# Patient Record
Sex: Female | Born: 1965 | Race: White | Hispanic: No | Marital: Married | Smoking: Never smoker
Health system: Southern US, Community
[De-identification: ages and names within clinical notes are randomized; demographics above are authoritative.]

## PROBLEM LIST (undated history)

## (undated) DIAGNOSIS — I10 Essential (primary) hypertension: Secondary | ICD-10-CM

## (undated) DIAGNOSIS — F32A Depression, unspecified: Secondary | ICD-10-CM

## (undated) DIAGNOSIS — F419 Anxiety disorder, unspecified: Secondary | ICD-10-CM

## (undated) DIAGNOSIS — N189 Chronic kidney disease, unspecified: Secondary | ICD-10-CM

## (undated) DIAGNOSIS — M1990 Unspecified osteoarthritis, unspecified site: Secondary | ICD-10-CM

## (undated) DIAGNOSIS — R9230 Dense breasts, unspecified: Secondary | ICD-10-CM

## (undated) DIAGNOSIS — R922 Inconclusive mammogram: Secondary | ICD-10-CM

## (undated) DIAGNOSIS — I509 Heart failure, unspecified: Secondary | ICD-10-CM

## (undated) HISTORY — DX: Dense breasts, unspecified: R92.30

## (undated) HISTORY — DX: Inconclusive mammogram: R92.2

## (undated) HISTORY — PX: NO PAST SURGERIES: SHX2092

## (undated) HISTORY — DX: Chronic kidney disease, unspecified: N18.9

---

## 2000-10-05 ENCOUNTER — Ambulatory Visit (HOSPITAL_COMMUNITY): Admission: RE | Admit: 2000-10-05 | Discharge: 2000-10-05 | Payer: Self-pay | Admitting: Obstetrics and Gynecology

## 2000-10-05 ENCOUNTER — Encounter: Payer: Self-pay | Admitting: Obstetrics and Gynecology

## 2001-03-25 ENCOUNTER — Other Ambulatory Visit: Admission: RE | Admit: 2001-03-25 | Discharge: 2001-03-25 | Payer: Self-pay | Admitting: Obstetrics and Gynecology

## 2001-10-19 ENCOUNTER — Ambulatory Visit (HOSPITAL_COMMUNITY): Admission: RE | Admit: 2001-10-19 | Discharge: 2001-10-19 | Payer: Self-pay | Admitting: Obstetrics and Gynecology

## 2001-10-19 ENCOUNTER — Encounter: Payer: Self-pay | Admitting: Obstetrics and Gynecology

## 2002-08-10 ENCOUNTER — Other Ambulatory Visit: Admission: RE | Admit: 2002-08-10 | Discharge: 2002-08-10 | Payer: Self-pay | Admitting: Obstetrics and Gynecology

## 2003-12-07 ENCOUNTER — Encounter: Admission: RE | Admit: 2003-12-07 | Discharge: 2003-12-07 | Payer: Self-pay | Admitting: Obstetrics and Gynecology

## 2004-01-24 ENCOUNTER — Other Ambulatory Visit: Admission: RE | Admit: 2004-01-24 | Discharge: 2004-01-24 | Payer: Self-pay | Admitting: Obstetrics and Gynecology

## 2005-02-10 ENCOUNTER — Other Ambulatory Visit: Admission: RE | Admit: 2005-02-10 | Discharge: 2005-02-10 | Payer: Self-pay | Admitting: Obstetrics and Gynecology

## 2005-03-17 ENCOUNTER — Encounter: Admission: RE | Admit: 2005-03-17 | Discharge: 2005-03-17 | Payer: Self-pay | Admitting: Obstetrics and Gynecology

## 2006-02-18 ENCOUNTER — Other Ambulatory Visit: Admission: RE | Admit: 2006-02-18 | Discharge: 2006-02-18 | Payer: Self-pay | Admitting: Obstetrics and Gynecology

## 2006-03-23 ENCOUNTER — Encounter: Admission: RE | Admit: 2006-03-23 | Discharge: 2006-03-23 | Payer: Self-pay | Admitting: Obstetrics and Gynecology

## 2007-04-20 ENCOUNTER — Encounter: Admission: RE | Admit: 2007-04-20 | Discharge: 2007-04-20 | Payer: Self-pay | Admitting: Obstetrics and Gynecology

## 2008-10-31 ENCOUNTER — Encounter: Admission: RE | Admit: 2008-10-31 | Discharge: 2008-10-31 | Payer: Self-pay | Admitting: Obstetrics and Gynecology

## 2010-02-14 ENCOUNTER — Encounter: Admission: RE | Admit: 2010-02-14 | Discharge: 2010-02-14 | Payer: Self-pay | Admitting: Obstetrics and Gynecology

## 2010-07-20 ENCOUNTER — Encounter: Payer: Self-pay | Admitting: Obstetrics and Gynecology

## 2011-09-01 ENCOUNTER — Other Ambulatory Visit: Payer: Self-pay | Admitting: Obstetrics and Gynecology

## 2011-09-01 DIAGNOSIS — Z1231 Encounter for screening mammogram for malignant neoplasm of breast: Secondary | ICD-10-CM

## 2011-09-10 ENCOUNTER — Ambulatory Visit
Admission: RE | Admit: 2011-09-10 | Discharge: 2011-09-10 | Disposition: A | Discharge status: Home / Self Care | Payer: 59 | Source: Ambulatory Visit | Attending: Obstetrics and Gynecology | Admitting: Obstetrics and Gynecology

## 2011-09-10 DIAGNOSIS — Z1231 Encounter for screening mammogram for malignant neoplasm of breast: Secondary | ICD-10-CM

## 2011-09-12 ENCOUNTER — Ambulatory Visit: Payer: Self-pay

## 2011-09-12 ENCOUNTER — Encounter (HOSPITAL_BASED_OUTPATIENT_CLINIC_OR_DEPARTMENT_OTHER): Payer: Self-pay | Admitting: *Deleted

## 2011-09-12 NOTE — Progress Notes (Signed)
To come in for bmet and ekg-has never had ekg-on bp meds To bring names of meds and doses Bring meds dos-and take bp med with water

## 2011-09-15 ENCOUNTER — Encounter (HOSPITAL_BASED_OUTPATIENT_CLINIC_OR_DEPARTMENT_OTHER)
Admission: RE | Admit: 2011-09-15 | Discharge: 2011-09-15 | Disposition: A | Discharge status: Home / Self Care | Payer: 59 | Source: Ambulatory Visit | Attending: Plastic Surgery | Admitting: Plastic Surgery

## 2011-09-15 ENCOUNTER — Other Ambulatory Visit: Payer: Self-pay

## 2011-09-15 LAB — BASIC METABOLIC PANEL
BUN: 17 mg/dL (ref 6–23)
CO2: 32 mEq/L (ref 19–32)
Calcium: 9.2 mg/dL (ref 8.4–10.5)
Chloride: 100 mEq/L (ref 96–112)
Creatinine, Ser: 1.2 mg/dL — ABNORMAL HIGH (ref 0.50–1.10)
GFR calc Af Amer: 62 mL/min — ABNORMAL LOW (ref >90)
GFR calc non Af Amer: 53 mL/min — ABNORMAL LOW (ref >90)
Glucose, Bld: 87 mg/dL (ref 70–99)
Potassium: 3.3 mEq/L — ABNORMAL LOW (ref 3.5–5.1)
Sodium: 140 mEq/L (ref 135–145)

## 2011-09-16 ENCOUNTER — Ambulatory Visit (HOSPITAL_BASED_OUTPATIENT_CLINIC_OR_DEPARTMENT_OTHER)
Admission: RE | Admit: 2011-09-16 | Discharge: 2011-09-16 | Disposition: A | Discharge status: Home / Self Care | Payer: 59 | Source: Ambulatory Visit | Attending: Plastic Surgery | Admitting: Plastic Surgery

## 2011-09-16 ENCOUNTER — Encounter (HOSPITAL_BASED_OUTPATIENT_CLINIC_OR_DEPARTMENT_OTHER): Payer: Self-pay | Admitting: Certified Registered Nurse Anesthetist

## 2011-09-16 ENCOUNTER — Ambulatory Visit (HOSPITAL_BASED_OUTPATIENT_CLINIC_OR_DEPARTMENT_OTHER): Payer: 59 | Admitting: Certified Registered Nurse Anesthetist

## 2011-09-16 ENCOUNTER — Encounter (HOSPITAL_BASED_OUTPATIENT_CLINIC_OR_DEPARTMENT_OTHER): Payer: Self-pay | Admitting: *Deleted

## 2011-09-16 ENCOUNTER — Encounter (HOSPITAL_BASED_OUTPATIENT_CLINIC_OR_DEPARTMENT_OTHER)
Admission: RE | Disposition: A | Discharge status: Home / Self Care | Payer: Self-pay | Source: Ambulatory Visit | Attending: Plastic Surgery

## 2011-09-16 DIAGNOSIS — N62 Hypertrophy of breast: Secondary | ICD-10-CM | POA: Insufficient documentation

## 2011-09-16 DIAGNOSIS — Z01810 Encounter for preprocedural cardiovascular examination: Secondary | ICD-10-CM | POA: Insufficient documentation

## 2011-09-16 HISTORY — PX: BREAST REDUCTION SURGERY: SHX8

## 2011-09-16 HISTORY — DX: Essential (primary) hypertension: I10

## 2011-09-16 HISTORY — DX: Unspecified osteoarthritis, unspecified site: M19.90

## 2011-09-16 LAB — POCT HEMOGLOBIN-HEMACUE: Hemoglobin: 12.4 g/dL (ref 12.0–15.0)

## 2011-09-16 SURGERY — MAMMOPLASTY, REDUCTION
Anesthesia: General | Site: Breast | Laterality: Bilateral | Wound class: Clean

## 2011-09-16 MED ORDER — LIDOCAINE HCL (CARDIAC) 20 MG/ML IV SOLN
INTRAVENOUS | Status: DC | PRN
  Administered 2011-09-16: 60 mg via INTRAVENOUS

## 2011-09-16 MED ORDER — BACITRACIN ZINC 500 UNIT/GM EX OINT
TOPICAL_OINTMENT | CUTANEOUS | Status: DC | PRN
  Administered 2011-09-16: 2 via TOPICAL

## 2011-09-16 MED ORDER — DEXAMETHASONE SODIUM PHOSPHATE 4 MG/ML IJ SOLN
INTRAMUSCULAR | Status: DC | PRN
  Administered 2011-09-16: 10 mg via INTRAVENOUS

## 2011-09-16 MED ORDER — PROPOFOL 10 MG/ML IV EMUL
INTRAVENOUS | Status: DC | PRN
  Administered 2011-09-16: 240 mg via INTRAVENOUS

## 2011-09-16 MED ORDER — OXYCODONE-ACETAMINOPHEN 5-325 MG PO TABS
1.0000 | ORAL_TABLET | Freq: Once | ORAL | Status: AC
  Administered 2011-09-16: 1 via ORAL

## 2011-09-16 MED ORDER — EPHEDRINE SULFATE 50 MG/ML IJ SOLN
INTRAMUSCULAR | Status: DC | PRN
  Administered 2011-09-16: 10 mg via INTRAVENOUS

## 2011-09-16 MED ORDER — HYDROMORPHONE HCL PF 1 MG/ML IJ SOLN
0.2500 mg | INTRAMUSCULAR | Status: DC | PRN
  Administered 2011-09-16 (×3): 0.5 mg via INTRAVENOUS

## 2011-09-16 MED ORDER — MIDAZOLAM HCL 5 MG/5ML IJ SOLN
INTRAMUSCULAR | Status: DC | PRN
  Administered 2011-09-16: 2 mg via INTRAVENOUS

## 2011-09-16 MED ORDER — BUPIVACAINE-EPINEPHRINE 0.5% -1:200000 IJ SOLN
INTRAMUSCULAR | Status: DC | PRN
  Administered 2011-09-16: 40 mL

## 2011-09-16 MED ORDER — LACTATED RINGERS IV SOLN
INTRAVENOUS | Status: DC
  Administered 2011-09-16 (×3): via INTRAVENOUS

## 2011-09-16 MED ORDER — CEFAZOLIN SODIUM 1-5 GM-% IV SOLN: 1.0000 g | Freq: Once | INTRAVENOUS | Status: DC

## 2011-09-16 MED ORDER — ONDANSETRON HCL 4 MG/2ML IJ SOLN: 4.0000 mg | Freq: Once | INTRAMUSCULAR | Status: DC | PRN

## 2011-09-16 MED ORDER — SUCCINYLCHOLINE CHLORIDE 20 MG/ML IJ SOLN
INTRAMUSCULAR | Status: DC | PRN
  Administered 2011-09-16: 120 mg via INTRAVENOUS

## 2011-09-16 MED ORDER — FENTANYL CITRATE 0.05 MG/ML IJ SOLN
INTRAMUSCULAR | Status: DC | PRN
  Administered 2011-09-16 (×2): 50 ug via INTRAVENOUS
  Administered 2011-09-16 (×2): 100 ug via INTRAVENOUS
  Administered 2011-09-16: 50 ug via INTRAVENOUS

## 2011-09-16 MED ORDER — CEFAZOLIN SODIUM 1-5 GM-% IV SOLN
INTRAVENOUS | Status: DC | PRN
  Administered 2011-09-16: 2 g via INTRAVENOUS

## 2011-09-16 MED ORDER — LIDOCAINE-EPINEPHRINE 1 %-1:100000 IJ SOLN
INTRAMUSCULAR | Status: DC | PRN
  Administered 2011-09-16: 40 mL

## 2011-09-16 MED ORDER — 0.9 % SODIUM CHLORIDE (POUR BTL) OPTIME
TOPICAL | Status: DC | PRN
  Administered 2011-09-16 (×2): 450 mL

## 2011-09-16 MED ORDER — ONDANSETRON HCL 4 MG/2ML IJ SOLN
INTRAMUSCULAR | Status: DC | PRN
  Administered 2011-09-16: 4 mg via INTRAVENOUS

## 2011-09-16 SURGICAL SUPPLY — 68 items
APL SKNCLS STERI-STRIP NONHPOA (GAUZE/BANDAGES/DRESSINGS) ×2
BANDAGE GAUZE ELAST BULKY 4 IN (GAUZE/BANDAGES/DRESSINGS) ×4 IMPLANT
BENZOIN TINCTURE PRP APPL 2/3 (GAUZE/BANDAGES/DRESSINGS) ×4 IMPLANT
BLADE KNIFE PERSONA 10 (BLADE) ×8 IMPLANT
BLADE KNIFE PERSONA 15 (BLADE) ×6 IMPLANT
CANISTER SUCTION 1200CC (MISCELLANEOUS) ×2 IMPLANT
CAP BOUFFANT 24 BLUE NURSES (PROTECTIVE WEAR) ×2 IMPLANT
CLOTH BEACON ORANGE TIMEOUT ST (SAFETY) ×2 IMPLANT
COVER MAYO STAND STRL (DRAPES) ×2 IMPLANT
COVER TABLE BACK 60X90 (DRAPES) ×2 IMPLANT
DECANTER SPIKE VIAL GLASS SM (MISCELLANEOUS) ×4 IMPLANT
DRAIN CHANNEL 10F 3/8 F FF (DRAIN) ×4 IMPLANT
DRAPE LAPAROSCOPIC ABDOMINAL (DRAPES) ×2 IMPLANT
DRSG EMULSION OIL 3X3 NADH (GAUZE/BANDAGES/DRESSINGS) ×4 IMPLANT
DRSG PAD ABDOMINAL 8X10 ST (GAUZE/BANDAGES/DRESSINGS) ×4 IMPLANT
ELECT NDL TIP 2.8 STRL (NEEDLE) IMPLANT
ELECT NEEDLE TIP 2.8 STRL (NEEDLE) IMPLANT
ELECT REM PT RETURN 9FT ADLT (ELECTROSURGICAL) ×2
ELECTRODE REM PT RTRN 9FT ADLT (ELECTROSURGICAL) ×1 IMPLANT
EVACUATOR SILICONE 100CC (DRAIN) ×4 IMPLANT
FILTER 7/8 IN (FILTER) ×2 IMPLANT
GLOVE BIO SURGEON STRL SZ 6.5 (GLOVE) ×4 IMPLANT
GLOVE BIO SURGEON STRL SZ7 (GLOVE) ×3 IMPLANT
GLOVE BIOGEL M STRL SZ7.5 (GLOVE) ×1 IMPLANT
GLOVE BIOGEL PI IND STRL 6.5 (GLOVE) IMPLANT
GLOVE BIOGEL PI IND STRL 7.0 (GLOVE) IMPLANT
GLOVE BIOGEL PI IND STRL 7.5 (GLOVE) IMPLANT
GLOVE BIOGEL PI INDICATOR 6.5 (GLOVE)
GLOVE BIOGEL PI INDICATOR 7.0 (GLOVE) ×1
GLOVE BIOGEL PI INDICATOR 7.5 (GLOVE) ×3
GLOVE SKINSENSE NS SZ7.0 (GLOVE) ×1
GLOVE SKINSENSE STRL SZ7.0 (GLOVE) IMPLANT
GOWN PREVENTION PLUS XLARGE (GOWN DISPOSABLE) ×6 IMPLANT
GOWN PREVENTION PLUS XXLARGE (GOWN DISPOSABLE) ×1 IMPLANT
NDL HYPO 25X1 1.5 SAFETY (NEEDLE) ×1 IMPLANT
NDL SPNL 18GX3.5 QUINCKE PK (NEEDLE) ×1 IMPLANT
NEEDLE HYPO 25X1 1.5 SAFETY (NEEDLE) ×2 IMPLANT
NEEDLE SPNL 18GX3.5 QUINCKE PK (NEEDLE) ×2 IMPLANT
NS IRRIG 1000ML POUR BTL (IV SOLUTION) ×5 IMPLANT
PACK BASIN DAY SURGERY FS (CUSTOM PROCEDURE TRAY) ×2 IMPLANT
PIN SAFETY STERILE (MISCELLANEOUS) ×2 IMPLANT
SCRUB PCMX 4 OZ (MISCELLANEOUS) ×1 IMPLANT
SCRUB TECHNI CARE SURGICAL (MISCELLANEOUS) ×2 IMPLANT
SLEEVE SCD COMPRESS KNEE MED (MISCELLANEOUS) ×2 IMPLANT
SPECIMEN JAR MEDIUM (MISCELLANEOUS) ×5 IMPLANT
SPECIMEN JAR X LARGE (MISCELLANEOUS) IMPLANT
SPONGE GAUZE 4X4 12PLY (GAUZE/BANDAGES/DRESSINGS) ×4 IMPLANT
SPONGE LAP 18X18 X RAY DECT (DISPOSABLE) ×7 IMPLANT
STAPLER VISISTAT 35W (STAPLE) ×4 IMPLANT
STRIP CLOSURE SKIN 1/2X4 (GAUZE/BANDAGES/DRESSINGS) ×8 IMPLANT
SUT ETHILON 3 0 PS 1 (SUTURE) ×2 IMPLANT
SUT MNCRL AB 3-0 PS2 18 (SUTURE) ×8 IMPLANT
SUT MNCRL AB 4-0 PS2 18 (SUTURE) ×5 IMPLANT
SUT MON AB 5-0 PS2 18 (SUTURE) ×4 IMPLANT
SUT PROLENE 2 0 CT2 30 (SUTURE) ×2 IMPLANT
SUT PROLENE 3 0 PS 1 (SUTURE) ×4 IMPLANT
SUT QUILL PDO 2-0 (SUTURE) ×4 IMPLANT
SYR BULB IRRIGATION 50ML (SYRINGE) ×4 IMPLANT
SYR CONTROL 10ML LL (SYRINGE) ×6 IMPLANT
TOWEL OR 17X24 6PK STRL BLUE (TOWEL DISPOSABLE) ×5 IMPLANT
TOWEL OR NON WOVEN STRL DISP B (DISPOSABLE) ×2 IMPLANT
TRAY DSU PREP LF (CUSTOM PROCEDURE TRAY) ×2 IMPLANT
TRAY FOLEY CATH 14FR (SET/KITS/TRAYS/PACK) ×2 IMPLANT
TUBE CONNECTING 20X1/4 (TUBING) ×2 IMPLANT
UNDERPAD 30X30 INCONTINENT (UNDERPADS AND DIAPERS) ×4 IMPLANT
VAC PENCILS W/TUBING CLEAR (MISCELLANEOUS) ×2 IMPLANT
WATER STERILE IRR 1000ML POUR (IV SOLUTION) ×1 IMPLANT
YANKAUER SUCT BULB TIP NO VENT (SUCTIONS) ×2 IMPLANT

## 2011-09-16 NOTE — H&P (Signed)
  H & P faxed to surgical center. The patient has been re-examined, the H & P reviewed and there is no change in the plan of care.

## 2011-09-16 NOTE — Discharge Instructions (Signed)
1. No lifting greater than 5 lbs with arms for 4 weeks. 2. Empty, strip, record and reactivate JP drains three times a day. 3. Follow-up appointment Friday in office. 4. Percocet 5/325 mg tabs 1-2 tabs po q4-6 hours prn pain (prescription given in office). 5. Duricef 500 mg tab 1 tab po q 12 hours (prescription given in office). 6. Sterapred dose pack as directed (prescription given in office).  Call your surgeon if you experience:   1.  Fever over 101.0. 2.  Inability to urinate. 3.  Nausea and/or vomiting. 4.  Extreme swelling or bruising at the surgical site. 5.  Continued bleeding from the incision. 6.  Increased pain, redness or drainage from the incision. 7.  Problems related to your pain medication.   [facility]  [address] [place], [place] [address] [phone]   Post Anesthesia Home Care Instructions  Activity: Get plenty of rest for the remainder of the day. A responsible adult should stay with you for 24 hours following the procedure.  For the next 24 hours, DO NOT: -Drive a car -Advertising copywriter -Drink alcoholic beverages -Take any medication unless instructed by your physician -Make any legal decisions or sign important papers.  Meals: Start with liquid foods such as gelatin or soup. Progress to regular foods as tolerated. Avoid greasy, spicy, heavy foods. If nausea and/or vomiting occur, drink only clear liquids until the nausea and/or vomiting subsides. Call your physician if vomiting continues.  Special Instructions/Symptoms: Your throat may feel dry or sore from the anesthesia or the breathing tube placed in your throat during surgery. If this causes discomfort, gargle with warm salt water. The discomfort should disappear within 24 hours.

## 2011-09-16 NOTE — Anesthesia Procedure Notes (Signed)
Procedure Name: Intubation Date/Time: 09/16/2011 8:02 AM Performed by: Tobe Kervin D Pre-anesthesia Checklist: Patient identified, Emergency Drugs available, Suction available and Patient being monitored Patient Re-evaluated:Patient Re-evaluated prior to inductionOxygen Delivery Method: Circle System Utilized Preoxygenation: Pre-oxygenation with 100% oxygen Intubation Type: IV induction Ventilation: Mask ventilation without difficulty Laryngoscope Size: Mac and 3 Grade View: Grade I Tube type: Oral Number of attempts: 1 Airway Equipment and Method: stylet and oral airway Placement Confirmation: ETT inserted through vocal cords under direct vision,  positive ETCO2 and breath sounds checked- equal and bilateral Secured at: 21 cm Tube secured with: Tape Dental Injury: Teeth and Oropharynx as per pre-operative assessment

## 2011-09-16 NOTE — Anesthesia Postprocedure Evaluation (Signed)
  Anesthesia Post-op Note  Patient: Meredith Reed  Procedure(s) Performed: Procedure(s) (LRB): MAMMARY REDUCTION  (BREAST) (Bilateral)  Patient Location: PACU  Anesthesia Type: General  Level of Consciousness: awake, alert  and oriented  Airway and Oxygen Therapy: Patient Spontanous Breathing  Post-op Pain: mild  Post-op Assessment: Post-op Vital signs reviewed, Patient's Cardiovascular Status Stable, Respiratory Function Stable, Patent Airway, No signs of Nausea or vomiting and Pain level controlled  Post-op Vital Signs: Reviewed and stable  Complications: No apparent anesthesia complications

## 2011-09-16 NOTE — Anesthesia Preprocedure Evaluation (Signed)
Anesthesia Evaluation  Patient identified by MRN, date of birth, ID band Patient awake    Reviewed: Allergy & Precautions, H&P , NPO status , Patient's Chart, lab work & pertinent test results  Airway Mallampati: I TM Distance: >3 FB Neck ROM: Full    Dental  (+) Teeth Intact and Dental Advisory Given   Pulmonary  breath sounds clear to auscultation        Cardiovascular Rhythm:Regular Rate:Normal     Neuro/Psych    GI/Hepatic   Endo/Other    Renal/GU      Musculoskeletal   Abdominal   Peds  Hematology   Anesthesia Other Findings   Reproductive/Obstetrics                           Anesthesia Physical Anesthesia Plan  ASA: II  Anesthesia Plan: General   Post-op Pain Management:    Induction: Intravenous  Airway Management Planned: Oral ETT  Additional Equipment:   Intra-op Plan:   Post-operative Plan: Extubation in OR  Informed Consent: I have reviewed the patients History and Physical, chart, labs and discussed the procedure including the risks, benefits and alternatives for the proposed anesthesia with the patient or authorized representative who has indicated his/her understanding and acceptance.   Dental advisory given  Plan Discussed with: CRNA, Anesthesiologist and Surgeon  Anesthesia Plan Comments:         Anesthesia Quick Evaluation

## 2011-09-16 NOTE — Brief Op Note (Signed)
09/16/2011  11:27 AM  PATIENT:  Meredith Reed  46 y.o. female  PRE-OPERATIVE DIAGNOSIS:  Bilateral Macromastia  POST-OPERATIVE DIAGNOSIS:  Bilateral Macromastia  PROCEDURE:  Procedure(s) (LRB): MAMMARY REDUCTION  (BREAST) (Bilateral)  SURGEON:  Surgeon(s) and Role:    * Kayren Eaves Wyndell Cardiff, MD - Primary  ASSISTANTS: None   ANESTHESIA:   general  EBL:  Total I/O In: 2000 [I.V.:2000] Out: 675 [Urine:525; Blood:150]  BLOOD ADMINISTERED:none  DRAINS: (74F) Jackson-Pratt drain(s) with closed bulb suction in the breast   LOCAL MEDICATIONS USED:  MARCAINE     SPECIMEN:  Source of Specimen:  Breast  DISPOSITION OF SPECIMEN:  PATHOLOGY  COUNTS:  YES  DICTATION: .Other Dictation: Dictation Number [id number]  PLAN OF CARE: Discharge to home after PACU  PATIENT DISPOSITION:  PACU - hemodynamically stable.   Delay start of Pharmacological VTE agent (>24hrs) due to surgical blood loss or risk of bleeding: not applicable

## 2011-09-16 NOTE — Transfer of Care (Signed)
Immediate Anesthesia Transfer of Care Note  Patient: Meredith Reed  Procedure(s) Performed: Procedure(s) (LRB): MAMMARY REDUCTION  (BREAST) (Bilateral)  Patient Location: PACU  Anesthesia Type: General  Level of Consciousness: awake, alert , oriented and patient cooperative  Airway & Oxygen Therapy: Patient Spontanous Breathing and Patient connected to face mask oxygen  Post-op Assessment: Report given to PACU RN and Post -op Vital signs reviewed and stable  Post vital signs: Reviewed and stable  Complications: No apparent anesthesia complications

## 2011-09-17 NOTE — Op Note (Signed)
Meredith Reed, BROOK[place]               ACCOUNT NO.:  [id number]  MEDICAL RECORD NO.:  [id number]  LOCATION:                                 FACILITY:  PHYSICIAN:  Hetty Blend, M.D.DATE OF BIRTH:  May 22, 1966  DATE OF PROCEDURE:  09/16/2011 DATE OF DISCHARGE:  09/16/2011                              OPERATIVE REPORT   PREOPERATIVE DIAGNOSIS:  Bilateral macromastia.  POSTOPERATIVE DIAGNOSIS:  Bilateral macromastia.  PROCEDURE:  Bilateral reduction mammoplasties.  ATTENDING SURGEON:  Hetty Blend, MD  ANESTHESIA:  General.  ANESTHESIOLOGIST:  Dr. Al Corpus  FLUID REPLACEMENT:  2400 mL crystalloid.  URINE OUTPUT:  545 mL.  ESTIMATED BLOOD LOSS:  150 mL.  COMPLICATIONS:  None.  INDICATIONS FOR THE PROCEDURE:  The patient is a 46 year old, Caucasian female, who has bilateral macromastia.  She presents to undergo bilateral reduction mammoplasties.  PROCEDURE:  The patient was marked in the preop holding area in the pattern of WISE for the future bilateral reduction mammoplasties.  She was then taken back to the OR, placed on table in supine position. After adequate general anesthesia was obtained, the patient's chest was prepped with Techni-Care and draped in sterile fashion.  The bases of the breasts had been injected with 1% lidocaine with epinephrine.  After adequate hemostasis and anesthesia had taken effect, the procedure was begun.  Both of the breast reductions were performed in the following similar manner.  The nipple-areolar complex was marked with 45-mm nipple marker. The skin was then incised and de-epithelialized around the nipple- areolar complex down to the inframammary crease in the inferior pedicle pattern.  Next the medial,superior, and lateral skin flaps were elevated down to the chest wall.The excess fat and glandular tissue were removed from the inferior pedicle.  The nipple-areolar complex was examined and found to be pink and viable.  The wound was  irrigated with saline irrigation.  Meticulous hemostasis was obtained with the Bovie electrocautery.  The inferior pedicle was centralized using 3-0 Prolene suture.  A #10 JP flat fully fluted drain was placed into the wound.  The skin flaps closed at the inverted T junction with a 2-0 Prolene suture.  The incisions were then stapled for temporary closure.  The breasts were compared and found to have good shape and symmetry.  The incisions were then closed from the medial aspect of the [facility] incision from the medial aspect of the JP drain by 1st placing a few 3-0 Monocryl sutures in the dermal layer to loosely close it and then both the dermal and cuticular layer were closed with a 2-0 Quill PDO barbed suture.  Lateral to the JP drain, the incision was closed using 3-0 Monocryl in the dermal layer, followed by 3-0 Monocryl running intracuticular stitch on the skin.  The vertical limb of the WISE pattern was closed in the dermal layer using 3-0 Monocryl suture.  The patient was placed in the upright position.  The future location of the nipple-areolar complexes was marked on both breasts using the 45-mm nipple marker.  She was then placed back in the recumbent position.  Both of the nipple-areolar complexes were brought out onto the breast mounds in the following similar manner.  The skin was incised as marked and removed full thickness into the subcutaneous tissues.  The nipple- areolar complex was examined, found to be pink and viable, then brought out through this aperture and sewn in place using 4-0 Monocryl in the dermal layer, followed by 5-0 Monocryl running intracuticular stitch on the skin.  The cuticular layer of the vertical limb was also closed using 5-0 Monocryl in continuity with the nipple-areolar closure.  The drains were sewn in place using 3-0 nylon suture.  The bases of the breast as well as the pectoralis muscle were infiltrated with 0.5% Marcaine with epinephrine to  provide a postsurgical anesthetic block. The incisions were dressed with benzoin and Steri-Strips and the nipples additionally with bacitracin ointment and Adaptic.  The 4x4s were placed over the incisions, and the patient was placed into light postoperative support bra.  There were no complications.  The patient tolerated the procedure well.  The final needle and sponge counts were reported to be correct.  The patient was then extubated and taken to the recovery room in stable condition.  She was also recovered without complications.  Both the patient and her husband were given proper postoperative wound care instructions, including care of the JP drains.  She was then discharged home in his care in stable condition.  Followup appointment will be on Friday in the office.          ______________________________ Hetty Blend, M.D.     MC/MEDQ  D:  09/16/2011  T:  09/16/2011  Job:  [id number]

## 2011-09-18 ENCOUNTER — Encounter (HOSPITAL_BASED_OUTPATIENT_CLINIC_OR_DEPARTMENT_OTHER): Payer: Self-pay | Admitting: Plastic Surgery

## 2012-03-18 DIAGNOSIS — M1990 Unspecified osteoarthritis, unspecified site: Secondary | ICD-10-CM | POA: Insufficient documentation

## 2012-03-18 DIAGNOSIS — I10 Essential (primary) hypertension: Secondary | ICD-10-CM | POA: Insufficient documentation

## 2012-03-18 DIAGNOSIS — R922 Inconclusive mammogram: Secondary | ICD-10-CM | POA: Insufficient documentation

## 2012-03-22 ENCOUNTER — Ambulatory Visit: Payer: 59 | Admitting: Obstetrics and Gynecology

## 2012-07-22 ENCOUNTER — Telehealth: Payer: Self-pay | Admitting: Obstetrics and Gynecology

## 2012-07-22 NOTE — Telephone Encounter (Signed)
Triage to address

## 2012-07-22 NOTE — Telephone Encounter (Signed)
[facility] pt

## 2012-07-23 NOTE — Telephone Encounter (Signed)
Tc TO pt.   States has been having cycles q 2-3 weeks , sometimes like normal menses and sometimes spotting. No pain or cramping. Not taking any hormones. Is due for annual. Scheduled for eval with EP 06/01/13.  Pt is agreeable with this appt.  To call with bleeding pad or more/hr or any concerns.  Pt verbalizes comprehension.

## 2012-08-02 ENCOUNTER — Ambulatory Visit: Payer: 59 | Admitting: Obstetrics and Gynecology

## 2012-08-02 ENCOUNTER — Encounter: Payer: Self-pay | Admitting: Obstetrics and Gynecology

## 2012-08-02 VITALS — BP 118/70 | Temp 98.8°F | Ht 66.0 in | Wt 209.0 lb

## 2012-08-02 DIAGNOSIS — R3911 Hesitancy of micturition: Secondary | ICD-10-CM

## 2012-08-02 DIAGNOSIS — N926 Irregular menstruation, unspecified: Secondary | ICD-10-CM

## 2012-08-02 DIAGNOSIS — N390 Urinary tract infection, site not specified: Secondary | ICD-10-CM

## 2012-08-02 DIAGNOSIS — B9689 Other specified bacterial agents as the cause of diseases classified elsewhere: Secondary | ICD-10-CM

## 2012-08-02 DIAGNOSIS — N898 Other specified noninflammatory disorders of vagina: Secondary | ICD-10-CM

## 2012-08-02 MED ORDER — METRONIDAZOLE 500 MG PO TABS: 500.0000 mg | ORAL_TABLET | Freq: Two times a day (BID) | ORAL | Status: DC

## 2012-08-02 MED ORDER — CIPROFLOXACIN HCL 500 MG PO TABS: 500.0000 mg | ORAL_TABLET | Freq: Two times a day (BID) | ORAL | Status: DC

## 2012-08-02 NOTE — Progress Notes (Signed)
Vaginal discharge: clearthin mucoid Itching / Burning: no Fever: no  Symptoms have been present for 2 months. Has used over-the-counter treatment: yes otc yeast med.  ? name Associated symptoms:  Pelvic pain: no       Dyspareunia: no     Odor:  yes  History of STD:  no history of PID, STD's STD screen:declined  PT ALSO STATES HER CYCLES ARE CLOSER TOGETHER AND C/O VAGINAL DRYNESS; PT STOPPED PROGESTERONE CREAM AND DO NOT KNOW IF THAT IS THE PROBLEM.

## 2012-08-02 NOTE — Patient Instructions (Signed)
Avoid: - excess soap on genital area (consider using plain oatmeal soap) - use of powder or sprays in genital area - douching - wearing underwear to bed (except with menses) - using more than is directed detergent when washing clothes - tight fitting garments around genital area - excess sugar intake   

## 2012-08-02 NOTE — Progress Notes (Signed)
47 YO complains of vaginal dryness and states that her periods are closer together. Periods occur every 14 days for the past 2 months (instead of 3 weeks).  Flow 3 days with pad change 4 times a day and no cramping.  Has had some spotting x 2 days randomly with panty liner change 2 xs /day.  Denies bleeding with intercourse, stopped using  Progesterone 14 days out of the month and has experience some urinary hesitancy with voiding small amounts when she goes.  Denies any bowel changes, pelvic pain or fever.  O: Pelvic:EGBUS-normal, vagina-moderate mucoid discharge, cervix-no lesions, uterus/adnexae-no tenderness or masses  Wet Prep:  pH-5.5, whiff-positive,  + clue cells U/A- leuk- 1+,  pH-8.0, SG-1.005  protein & blood 2+ UPT-negative  A: BV     Change in Menses (shortened cycle)     UTI  P:  CBC, TSH- pending      Consult Dr. Leo Grosser about further follow up of patient's menstrual changes       Metronidazole 500 mg  #14 bid x 7 days no refills       Cipro 500 mg #6 bid x 3 days no refills       Increase water intake       RTO-as scheduled or prn  Meredith Nutting,PA-C

## 2012-08-03 LAB — CBC
HCT: 37.6 % (ref 34.0–46.6)
Hemoglobin: 12.7 g/dL (ref 11.1–15.9)
MCH: 32 pg (ref 26.6–33.0)
MCHC: 33.8 g/dL (ref 31.5–35.7)
MCV: 95 fL (ref 79–97)
Platelets: 290 10*3/uL (ref 155–379)
RBC: 3.97 x10E6/uL (ref 3.77–5.28)
RDW: 13.1 % (ref 12.3–15.4)
WBC: 7.5 10*3/uL (ref 3.4–10.8)

## 2012-08-03 LAB — URINE CULTURE

## 2012-08-03 LAB — TSH: TSH: 2.51 u[IU]/mL (ref 0.450–4.500)

## 2012-08-09 ENCOUNTER — Telehealth: Payer: Self-pay

## 2012-08-09 DIAGNOSIS — N926 Irregular menstruation, unspecified: Secondary | ICD-10-CM

## 2012-08-09 MED ORDER — MISOPROSTOL 200 MCG PO TABS: ORAL_TABLET | ORAL | Status: DC

## 2012-08-09 NOTE — Telephone Encounter (Signed)
Message copied by Gus Puma on Mon Aug 09, 2012  3:18 PM ------      Message from: Earnstine Regal      Created: Fri Aug 06, 2012  4:58 PM      Regarding: Gus Rankin,  Please schedule this patient with Dr. Leo Grosser for a [facility] (she's her patient).  Thanks,  EP      ----- Message -----         From: Eldred Manges, MD         Sent: 08/05/2012  10:40 PM           To: Earnstine Regal, PA            shg      ----- Message -----         From: Earnstine Regal, PA         Sent: 08/02/2012   1:05 PM           To: Eldred Manges, MD            Patient with vaginal bleeding every 14 days with occasional spotting.  Awaiting results of TSH,  if normal do you recommend an SHG or ultrasound for further evaluation?  EP             ------

## 2012-08-09 NOTE — Telephone Encounter (Signed)
Tc to pt. SHG/Cytotec info mailed to pt. [place] sched 08/24/12 @ 11:00a and FU with [facility]. Pt voices understanding.

## 2012-08-24 ENCOUNTER — Ambulatory Visit: Payer: 59 | Admitting: Obstetrics and Gynecology

## 2012-08-24 ENCOUNTER — Other Ambulatory Visit: Payer: Self-pay | Admitting: Obstetrics and Gynecology

## 2012-08-24 ENCOUNTER — Ambulatory Visit: Payer: 59

## 2012-08-24 DIAGNOSIS — N926 Irregular menstruation, unspecified: Secondary | ICD-10-CM

## 2012-08-24 DIAGNOSIS — IMO0002 Reserved for concepts with insufficient information to code with codable children: Secondary | ICD-10-CM

## 2012-08-24 LAB — POCT URINE PREGNANCY: Preg Test, Ur: NEGATIVE

## 2012-08-24 NOTE — Progress Notes (Signed)
SONOHYSTEROGRAM  Indications for the procedure, risks and benefits have been reviewed.  Questions were answered.   A permit has been signed.  Marland Kitchen   PROCEDURE The vagina and cervix were prepped with Betadine.  The sonohysterogram catheter was placed inside the uterus. 20 cc of sterile saline were injected.  A 3-D ultrasound was performed.   ENDOMETRIAL BIOPSY   INDICATION: metrorrhagia  UPT: Negative  Ultrasound findings: see below Risks and benefits have been discussed  PROCEDURE Cervix prepped with betadine Tenaculum applied to anterior lip of the cervix: no Uterus sounded at  9  cm Endometrial biopsy easily performed with Pipelle Well tolerated  Specimen appropriately identified and sent to patholgy   The patient tolerated her procedure well.  All instruments were removed.  The patient was returned to the supine position.   ULTRASOUND: Uterus: Length: 6.11 cm   Width:  4.80 cm   Height:  3.17 cm  Endo thickness:  0.493 cm   Left ovary:Normal Right ovary:Abnormal - Simple,dominant follicle-wnl's  Fibroids:yes Intramural fibroids are seen                      1)Anterior:1.4cm x 1.2cm x 1.2cm           2)Anterior left: 1.3cm x 1.3cm x 1.2cm CDS fluid:no  Comment: Smooth endometrial walls are observed. No focal lesion is identified.    Assessment: Metrorrhagia Fibroids  Plan: Will followup based on bx results

## 2012-08-26 LAB — PATHOLOGY

## 2012-09-13 ENCOUNTER — Encounter: Payer: 59 | Admitting: Obstetrics and Gynecology

## 2013-07-11 ENCOUNTER — Other Ambulatory Visit: Payer: Self-pay

## 2013-07-11 DIAGNOSIS — Z9889 Other specified postprocedural states: Secondary | ICD-10-CM

## 2013-07-11 DIAGNOSIS — Z1231 Encounter for screening mammogram for malignant neoplasm of breast: Secondary | ICD-10-CM

## 2013-07-28 ENCOUNTER — Ambulatory Visit: Payer: 59

## 2013-08-12 ENCOUNTER — Ambulatory Visit
Admission: RE | Admit: 2013-08-12 | Discharge: 2013-08-12 | Disposition: A | Discharge status: Home / Self Care | Payer: 59 | Source: Ambulatory Visit

## 2013-08-12 DIAGNOSIS — Z9889 Other specified postprocedural states: Secondary | ICD-10-CM

## 2013-08-12 DIAGNOSIS — Z1231 Encounter for screening mammogram for malignant neoplasm of breast: Secondary | ICD-10-CM

## 2014-01-25 ENCOUNTER — Other Ambulatory Visit (HOSPITAL_COMMUNITY): Payer: Self-pay | Admitting: Nephrology

## 2014-01-25 DIAGNOSIS — N183 Chronic kidney disease, stage 3 unspecified: Secondary | ICD-10-CM

## 2014-01-25 DIAGNOSIS — R809 Proteinuria, unspecified: Secondary | ICD-10-CM

## 2014-02-14 ENCOUNTER — Other Ambulatory Visit: Payer: Self-pay | Admitting: Radiology

## 2014-02-15 ENCOUNTER — Other Ambulatory Visit: Payer: Self-pay | Admitting: Radiology

## 2014-02-17 ENCOUNTER — Encounter (HOSPITAL_COMMUNITY): Payer: Self-pay

## 2014-02-17 ENCOUNTER — Ambulatory Visit (HOSPITAL_COMMUNITY)
Admission: RE | Admit: 2014-02-17 | Discharge: 2014-02-17 | Disposition: A | Discharge status: Home / Self Care | Payer: 59 | Source: Ambulatory Visit | Attending: Nephrology | Admitting: Nephrology

## 2014-02-17 DIAGNOSIS — N183 Chronic kidney disease, stage 3 unspecified: Secondary | ICD-10-CM | POA: Diagnosis present

## 2014-02-17 DIAGNOSIS — Z79899 Other long term (current) drug therapy: Secondary | ICD-10-CM | POA: Insufficient documentation

## 2014-02-17 DIAGNOSIS — I129 Hypertensive chronic kidney disease with stage 1 through stage 4 chronic kidney disease, or unspecified chronic kidney disease: Secondary | ICD-10-CM | POA: Insufficient documentation

## 2014-02-17 DIAGNOSIS — R809 Proteinuria, unspecified: Secondary | ICD-10-CM | POA: Insufficient documentation

## 2014-02-17 LAB — PROTIME-INR
INR: 0.92 (ref 0.00–1.49)
Prothrombin Time: 12.4 seconds (ref 11.6–15.2)

## 2014-02-17 LAB — CBC
HCT: 35.3 % — ABNORMAL LOW (ref 36.0–46.0)
Hemoglobin: 12.1 g/dL (ref 12.0–15.0)
MCH: 32.2 pg (ref 26.0–34.0)
MCHC: 34.3 g/dL (ref 30.0–36.0)
MCV: 93.9 fL (ref 78.0–100.0)
Platelets: 263 10*3/uL (ref 150–400)
RBC: 3.76 MIL/uL — ABNORMAL LOW (ref 3.87–5.11)
RDW: 12.7 % (ref 11.5–15.5)
WBC: 5.7 10*3/uL (ref 4.0–10.5)

## 2014-02-17 LAB — APTT: aPTT: 27 seconds (ref 24–37)

## 2014-02-17 IMAGING — US US BIOPSY
1 series · 11 of 11 positions shown · non-contrast
Comparison: none

CLINICAL DATA: Stage 3 chronic kidney disease, proteinuria

[Series 1: us biopsy · 0.22mm/px · 11 of 11 slices shown]
[im 1/11]
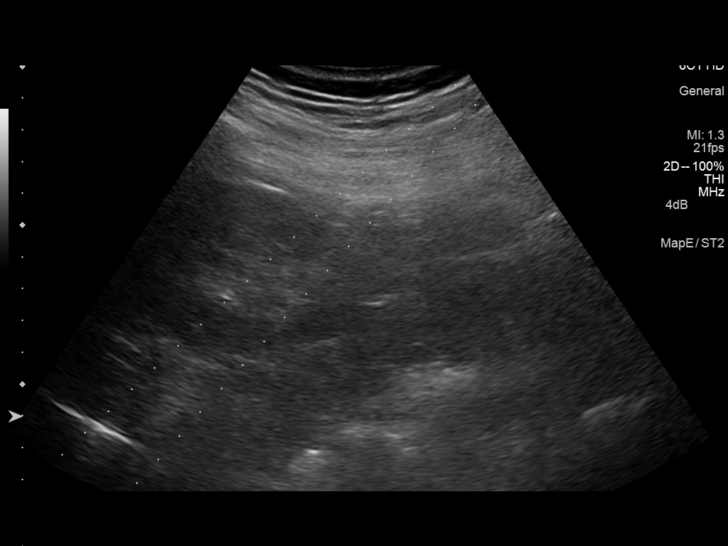
[im 2/11]
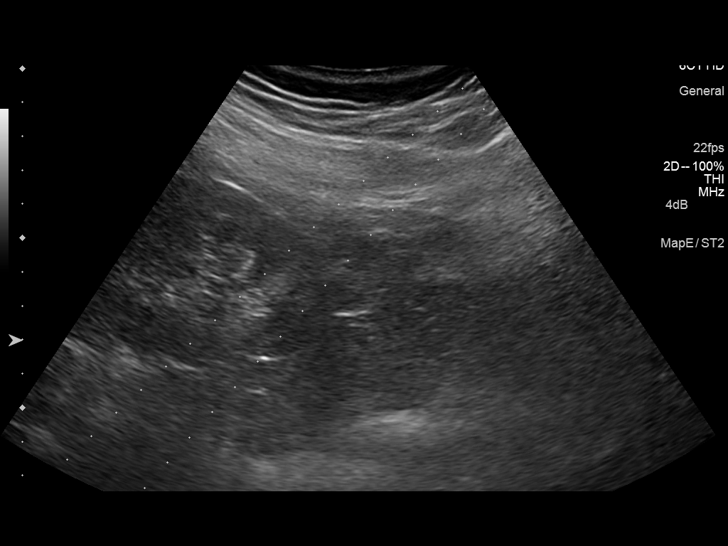
[im 3/11]
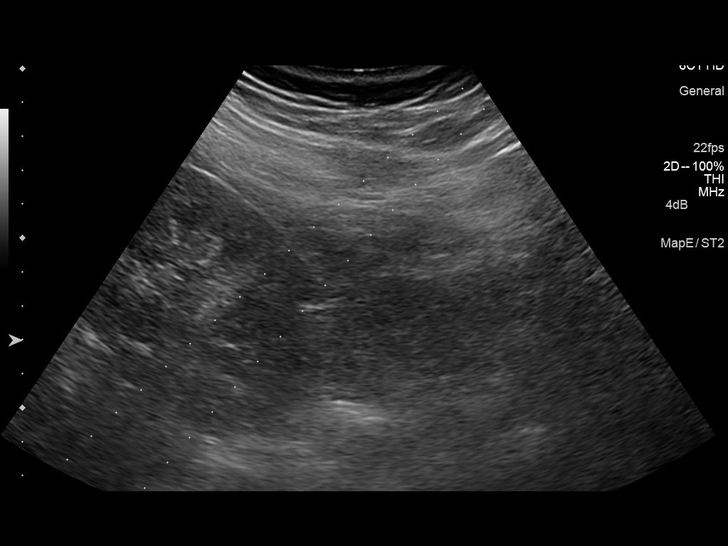
[im 4/11]
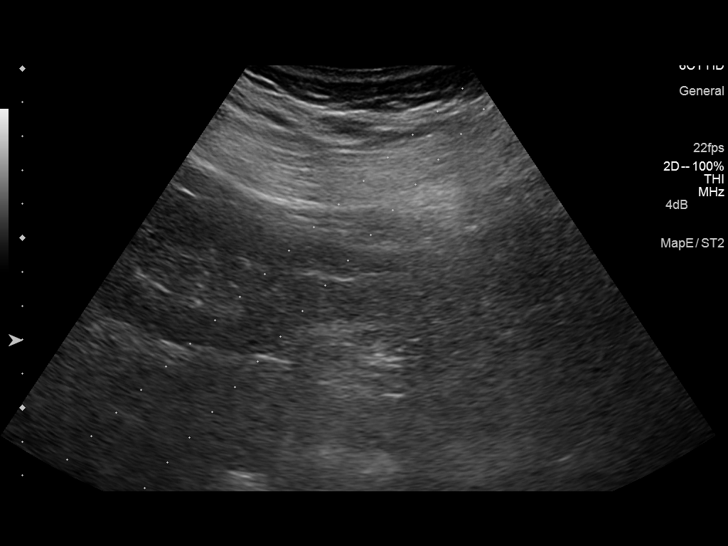
[im 5/11]
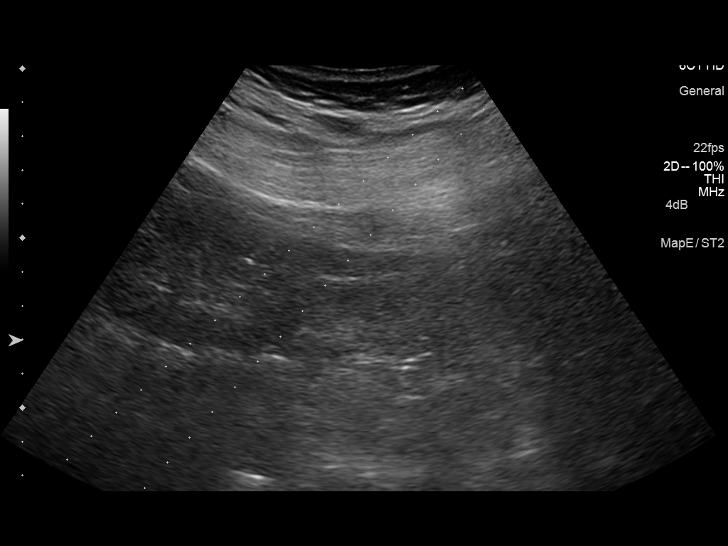
[im 6/11]
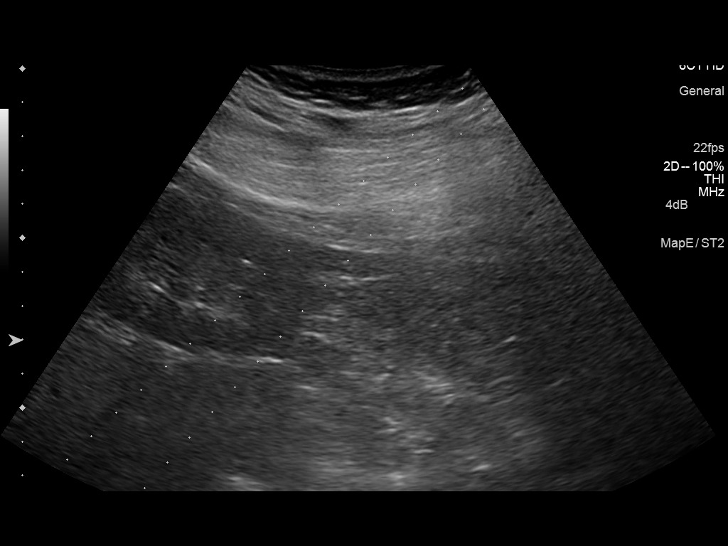
[im 7/11]
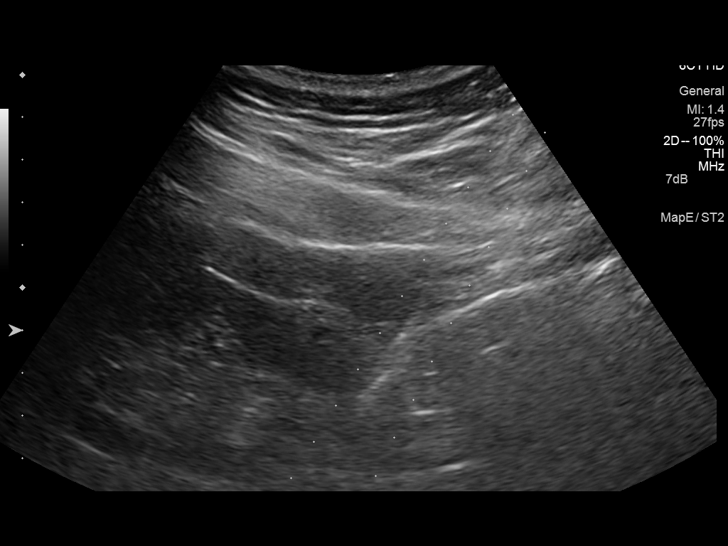
[im 8/11]
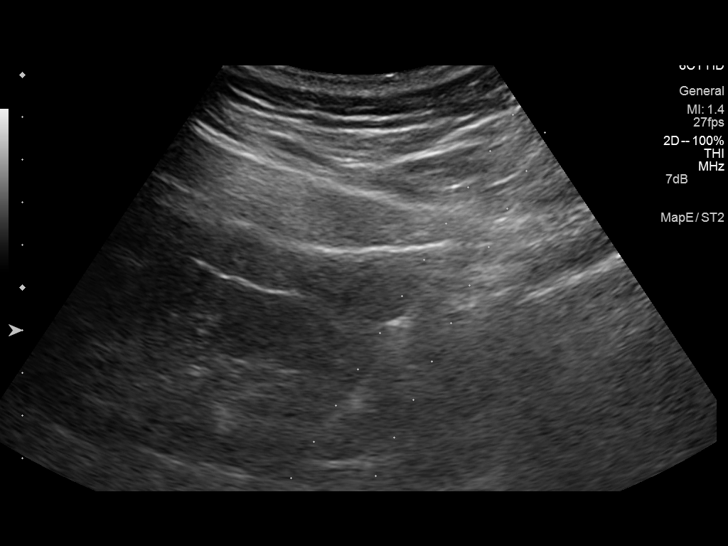
[im 9/11]
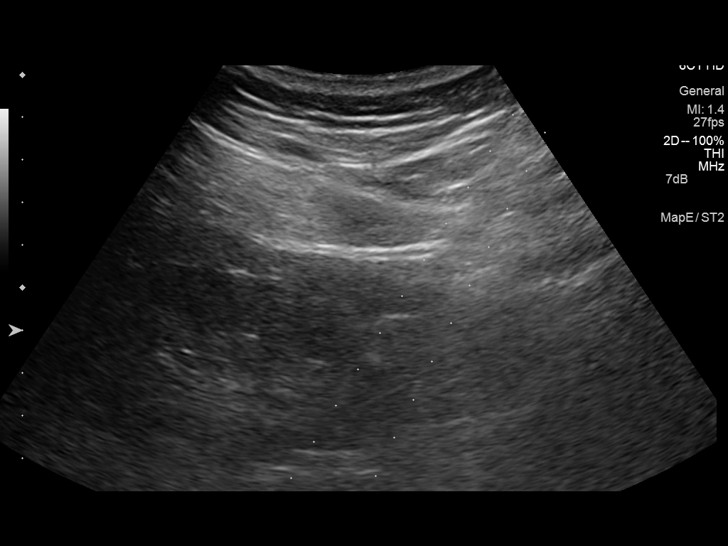
[im 10/11]
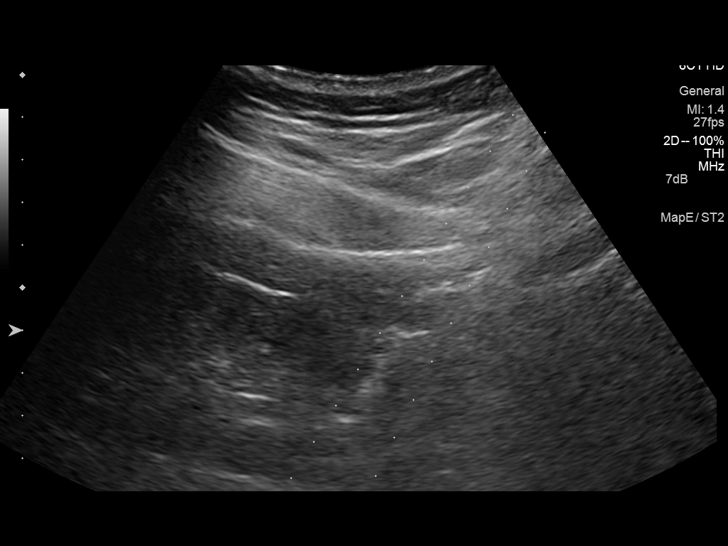
[im 11/11]
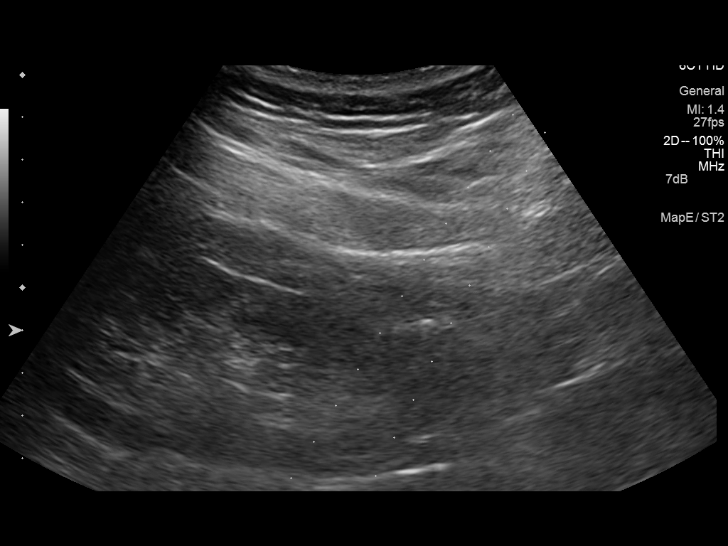

[11 of 11 positions shown; findings below may reference images not displayed]

EXAM:
ULTRASOUND GUIDED CORE BIOPSY OF LEFT RENAL LOWER POLE CORTEX

MEDICATIONS:
12.5 MCG mcg IV Fentanyl

Total Moderate Sedation Time: 15 MIN

PROCEDURE:
The procedure, risks, benefits, and alternatives were explained to
the patient. Questions regarding the procedure were encouraged and
answered. The patient understands and consents to the procedure.

The LEFT POSTERIOR FLANK was prepped with Betadine in a sterile
fashion, and a sterile drape was applied covering the operative
field. A sterile gown and sterile gloves were used for the
procedure. Local anesthesia was provided with 1% Lidocaine.

Patient positioned prone. Preliminary ultrasound performed. Left
kidney lower pole was localized. Under sterile conditions and local
anesthesia, a 16 gauge 15 cm core biopsy needle was advanced to the
left kidney lower pole cortex. Three passes were made for core
biopsy. Samples were placed in saline. No immediate complication.
Patient tolerated the biopsy well.

COMPLICATIONS:
No immediate
FINDINGS: Imaging confirms needle placed in in the left kidney lower pole
cortex
IMPRESSION: Successful ultrasound left kidney 16 gauge biopsy

## 2014-02-17 MED ORDER — MIDAZOLAM HCL 2 MG/2ML IJ SOLN
INTRAMUSCULAR | Status: AC
  Filled 2014-02-17: qty 2

## 2014-02-17 MED ORDER — SODIUM CHLORIDE 0.9 % IV SOLN
INTRAVENOUS | Status: DC
  Administered 2014-02-17: 10:00:00 via INTRAVENOUS

## 2014-02-17 MED ORDER — FENTANYL CITRATE 0.05 MG/ML IJ SOLN
INTRAMUSCULAR | Status: AC
  Filled 2014-02-17: qty 2

## 2014-02-17 MED ORDER — MIDAZOLAM HCL 2 MG/2ML IJ SOLN: INTRAMUSCULAR | Status: AC | PRN

## 2014-02-17 MED ORDER — FENTANYL CITRATE 0.05 MG/ML IJ SOLN
INTRAMUSCULAR | Status: AC | PRN
  Administered 2014-02-17: 12.5 ug via INTRAVENOUS

## 2014-02-17 MED ORDER — LIDOCAINE HCL (PF) 1 % IJ SOLN
INTRAMUSCULAR | Status: AC
  Filled 2014-02-17: qty 10

## 2014-02-17 NOTE — Discharge Instructions (Signed)
Kidney Biopsy, Care After Refer to this sheet in the next few weeks. These instructions provide you with information on caring for yourself after your procedure. Your health care provider may also give you more specific instructions. Your treatment has been planned according to current medical practices, but problems sometimes occur. Call your health care provider if you have any problems or questions after your procedure.  WHAT TO EXPECT AFTER THE PROCEDURE   You may notice blood in the urine for the first 24 hours after the biopsy.  You may feel some pain at the biopsy site for 1-2 weeks after the biopsy. HOME CARE INSTRUCTIONS  Do not lift anything heavier than 10 lb (4.5 kg) for 2 weeks.  Do not take any non-steroidal anti-inflammatory drugs (NSAIDs) or any blood thinners for a week after the biopsy unless instructed to do so by your health care provider.  Only take medicines for pain, fever, or discomfort as directed by your health care provider. SEEK MEDICAL CARE IF:  You have bloody urine more than 24 hours after the biopsy.   You develop a fever.   You cannot urinate.   You have increasing pain at the biopsy site.  SEEK IMMEDIATE MEDICAL CARE IF: You feel faint or dizzy.  Document Released: 02/16/2013 Document Reviewed: 02/16/2013 [facility] Patient Information 2015 [place]. This information is not intended to replace advice given to you by your health care provider. Make sure you discuss any questions you have with your health care provider.

## 2014-02-17 NOTE — Procedures (Signed)
[employer] [facility] RENAL CORE BX 16 G X 3 NO COMP STABLE PATH PENDING FULL REPORT IN PACS

## 2014-02-17 NOTE — H&P (Signed)
Chief Complaint: "I am here for a kidney biopsy." Referring Physician: Dr. Florene Glen HPI: Meredith Reed is an 48 y.o. female with CKD stage 3 and proteinuria. She does admit to some foamy urine and slight LE edema at the end of the day. She denies any hematuria. She denies any chest pain, shortness of breath or palpitations. She denies any active signs of bleeding or excessive bruising. She denies any recent fever or chills. The patient denies any history of sleep apnea or chronic oxygen use. She has previously tolerated anesthesia without complications.   Past Medical History:  Past Medical History  Diagnosis Date  . Hypertension   . Arthritis     knees  . Breast density     Past Surgical History:  Past Surgical History  Procedure Laterality Date  . No past surgeries    . Breast reduction surgery  09/16/2011    Procedure: MAMMARY REDUCTION  (BREAST);  Surgeon: Charlene Brooke, MD;  Location: [place];  Service: Plastics;  Laterality: Bilateral;    Family History:  Family History  Problem Relation Age of Onset  . Emphysema Father   . Hypertension Sister     x2  . Deep vein thrombosis Sister   . Stroke Sister   . Deep vein thrombosis Paternal Grandmother   . Diabetes Paternal Grandmother   . Cancer Paternal Grandfather     ? type  . Diabetes Paternal Grandfather   . Diabetes Mother   . Diabetes Maternal Aunt   . Diabetes Maternal Uncle   . Diabetes Maternal Uncle     Social History:  reports that she has never smoked. She has never used smokeless tobacco. She reports that she does not drink alcohol or use illicit drugs.  Allergies: No Known Allergies  Medications:   Medication List    ASK your doctor about these medications       amLODipine 10 MG tablet  Commonly known as:  NORVASC  Take 10 mg by mouth daily.     cholecalciferol 1000 UNITS tablet  Commonly known as:  VITAMIN D  Take 1,000 Units by mouth daily.     DULoxetine 60 MG capsule   Commonly known as:  CYMBALTA  Take 120 mg by mouth every evening.     losartan-hydrochlorothiazide 100-25 MG per tablet  Commonly known as:  HYZAAR  Take 1 tablet by mouth daily.     magnesium gluconate 500 MG tablet  Commonly known as:  MAGONATE  Take 500 mg by mouth daily as needed (night time leg cramps).     multivitamin with minerals tablet  Take 1 tablet by mouth daily.     vitamin C 500 MG tablet  Commonly known as:  ASCORBIC ACID  Take 500 mg by mouth daily.        Please HPI for pertinent positives, otherwise complete 10 system ROS negative.  Physical Exam: BP 116/74  Pulse 64  Temp(Src) 98.1 F (36.7 C) (Oral)  Resp 17  Ht 5\' 5"  (1.651 m)  Wt 200 lb (90.719 kg)  BMI 33.28 kg/m2  SpO2 100%  LMP 07/28/2012 Body mass index is 33.28 kg/(m^2).   General Appearance:  Alert, cooperative, no distress  Head:  Normocephalic, without obvious abnormality, atraumatic  Neck: Supple, symmetrical, trachea midline  Lungs:   Clear to auscultation bilaterally, no w/r/r, respirations unlabored without use of accessory muscles.  Chest Wall:  No tenderness or deformity  Heart:  Regular rate and rhythm, S1, S2 normal,  no murmur, rub or gallop.  Abdomen:   Soft, non-tender, non distended.  Extremities: Extremities normal, atraumatic, no cyanosis or edema  Neurologic: Normal affect, no gross deficits.   Results for orders placed during the hospital encounter of 02/17/14 (from the past 48 hour(s))  APTT     Status: None   Collection Time    02/17/14  9:23 AM      Result Value Ref Range   aPTT 27  24 - 37 seconds  CBC     Status: Abnormal   Collection Time    02/17/14  9:23 AM      Result Value Ref Range   WBC 5.7  4.0 - 10.5 K/uL   RBC 3.76 (*) 3.87 - 5.11 MIL/uL   Hemoglobin 12.1  12.0 - 15.0 g/dL   HCT 35.3 (*) 36.0 - 46.0 %   MCV 93.9  78.0 - 100.0 fL   MCH 32.2  26.0 - 34.0 pg   MCHC 34.3  30.0 - 36.0 g/dL   RDW 12.7  11.5 - 15.5 %   Platelets 263  150 - 400 K/uL   PROTIME-INR     Status: None   Collection Time    02/17/14  9:23 AM      Result Value Ref Range   Prothrombin Time 12.4  11.6 - 15.2 seconds   INR 0.92  0.00 - 1.49   No results found.  Assessment/Plan Chronic kidney disease Proteinuria Scheduled for image guided renal biopsy with minimal to moderate sedation, patient did take 1 mg Xanax at 0830 Patient has been NPO, no blood thinners taken, labs reviewed, BP 130/80 mmHg Risks and Benefits discussed with the patient. All of the patient's questions were answered, patient is agreeable to proceed. Consent signed and in chart.   Tsosie Billing D PA-C 02/17/2014, 10:44 AM

## 2014-02-24 ENCOUNTER — Encounter (HOSPITAL_COMMUNITY): Payer: Self-pay

## 2014-02-27 ENCOUNTER — Encounter (HOSPITAL_COMMUNITY): Payer: Self-pay

## 2014-05-01 ENCOUNTER — Encounter (HOSPITAL_COMMUNITY): Payer: Self-pay

## 2015-10-05 ENCOUNTER — Other Ambulatory Visit: Payer: Self-pay | Admitting: Gastroenterology

## 2016-07-21 DIAGNOSIS — Z0000 Encounter for general adult medical examination without abnormal findings: Secondary | ICD-10-CM | POA: Diagnosis not present

## 2016-07-21 DIAGNOSIS — I129 Hypertensive chronic kidney disease with stage 1 through stage 4 chronic kidney disease, or unspecified chronic kidney disease: Secondary | ICD-10-CM | POA: Diagnosis not present

## 2016-07-21 DIAGNOSIS — N390 Urinary tract infection, site not specified: Secondary | ICD-10-CM | POA: Diagnosis not present

## 2016-08-28 DIAGNOSIS — Z1231 Encounter for screening mammogram for malignant neoplasm of breast: Secondary | ICD-10-CM | POA: Diagnosis not present

## 2016-08-28 DIAGNOSIS — Z01419 Encounter for gynecological examination (general) (routine) without abnormal findings: Secondary | ICD-10-CM | POA: Diagnosis not present

## 2016-09-09 DIAGNOSIS — N183 Chronic kidney disease, stage 3 (moderate): Secondary | ICD-10-CM | POA: Diagnosis not present

## 2016-09-09 DIAGNOSIS — R809 Proteinuria, unspecified: Secondary | ICD-10-CM | POA: Diagnosis not present

## 2016-09-09 DIAGNOSIS — N058 Unspecified nephritic syndrome with other morphologic changes: Secondary | ICD-10-CM | POA: Diagnosis not present

## 2017-01-22 DIAGNOSIS — N183 Chronic kidney disease, stage 3 (moderate): Secondary | ICD-10-CM | POA: Diagnosis not present

## 2017-01-22 DIAGNOSIS — I129 Hypertensive chronic kidney disease with stage 1 through stage 4 chronic kidney disease, or unspecified chronic kidney disease: Secondary | ICD-10-CM | POA: Diagnosis not present

## 2017-03-11 DIAGNOSIS — N183 Chronic kidney disease, stage 3 (moderate): Secondary | ICD-10-CM | POA: Diagnosis not present

## 2017-03-11 DIAGNOSIS — R809 Proteinuria, unspecified: Secondary | ICD-10-CM | POA: Diagnosis not present

## 2017-03-11 DIAGNOSIS — N058 Unspecified nephritic syndrome with other morphologic changes: Secondary | ICD-10-CM | POA: Diagnosis not present

## 2017-05-08 DIAGNOSIS — I10 Essential (primary) hypertension: Secondary | ICD-10-CM | POA: Diagnosis not present

## 2017-05-08 DIAGNOSIS — N183 Chronic kidney disease, stage 3 (moderate): Secondary | ICD-10-CM | POA: Diagnosis not present

## 2017-05-08 DIAGNOSIS — R5383 Other fatigue: Secondary | ICD-10-CM | POA: Diagnosis not present

## 2017-07-20 DIAGNOSIS — M545 Low back pain: Secondary | ICD-10-CM | POA: Diagnosis not present

## 2017-07-20 DIAGNOSIS — Z0000 Encounter for general adult medical examination without abnormal findings: Secondary | ICD-10-CM | POA: Diagnosis not present

## 2017-07-20 DIAGNOSIS — I129 Hypertensive chronic kidney disease with stage 1 through stage 4 chronic kidney disease, or unspecified chronic kidney disease: Secondary | ICD-10-CM | POA: Diagnosis not present

## 2017-07-20 DIAGNOSIS — N390 Urinary tract infection, site not specified: Secondary | ICD-10-CM | POA: Diagnosis not present

## 2017-07-21 DIAGNOSIS — I129 Hypertensive chronic kidney disease with stage 1 through stage 4 chronic kidney disease, or unspecified chronic kidney disease: Secondary | ICD-10-CM | POA: Diagnosis not present

## 2017-07-23 DIAGNOSIS — H04123 Dry eye syndrome of bilateral lacrimal glands: Secondary | ICD-10-CM | POA: Diagnosis not present

## 2017-07-23 DIAGNOSIS — H5213 Myopia, bilateral: Secondary | ICD-10-CM | POA: Diagnosis not present

## 2017-07-23 DIAGNOSIS — H47323 Drusen of optic disc, bilateral: Secondary | ICD-10-CM | POA: Diagnosis not present

## 2017-09-02 DIAGNOSIS — N058 Unspecified nephritic syndrome with other morphologic changes: Secondary | ICD-10-CM | POA: Diagnosis not present

## 2017-09-02 DIAGNOSIS — N184 Chronic kidney disease, stage 4 (severe): Secondary | ICD-10-CM | POA: Diagnosis not present

## 2017-09-02 DIAGNOSIS — R809 Proteinuria, unspecified: Secondary | ICD-10-CM | POA: Diagnosis not present

## 2017-09-14 DIAGNOSIS — Z01419 Encounter for gynecological examination (general) (routine) without abnormal findings: Secondary | ICD-10-CM | POA: Diagnosis not present

## 2017-09-14 DIAGNOSIS — N952 Postmenopausal atrophic vaginitis: Secondary | ICD-10-CM | POA: Diagnosis not present

## 2017-09-14 DIAGNOSIS — Z1231 Encounter for screening mammogram for malignant neoplasm of breast: Secondary | ICD-10-CM | POA: Diagnosis not present

## 2018-01-21 DIAGNOSIS — I129 Hypertensive chronic kidney disease with stage 1 through stage 4 chronic kidney disease, or unspecified chronic kidney disease: Secondary | ICD-10-CM | POA: Diagnosis not present

## 2018-01-21 DIAGNOSIS — N184 Chronic kidney disease, stage 4 (severe): Secondary | ICD-10-CM | POA: Diagnosis not present

## 2018-01-21 DIAGNOSIS — M545 Low back pain: Secondary | ICD-10-CM | POA: Diagnosis not present

## 2018-02-25 DIAGNOSIS — N184 Chronic kidney disease, stage 4 (severe): Secondary | ICD-10-CM | POA: Diagnosis not present

## 2018-02-25 DIAGNOSIS — I10 Essential (primary) hypertension: Secondary | ICD-10-CM | POA: Diagnosis not present

## 2018-02-25 DIAGNOSIS — N058 Unspecified nephritic syndrome with other morphologic changes: Secondary | ICD-10-CM | POA: Diagnosis not present

## 2018-02-25 DIAGNOSIS — R809 Proteinuria, unspecified: Secondary | ICD-10-CM | POA: Diagnosis not present

## 2018-07-27 DIAGNOSIS — Z0000 Encounter for general adult medical examination without abnormal findings: Secondary | ICD-10-CM | POA: Diagnosis not present

## 2018-07-27 DIAGNOSIS — Z23 Encounter for immunization: Secondary | ICD-10-CM | POA: Diagnosis not present

## 2018-07-27 DIAGNOSIS — N184 Chronic kidney disease, stage 4 (severe): Secondary | ICD-10-CM | POA: Diagnosis not present

## 2018-07-27 DIAGNOSIS — Z832 Family history of diseases of the blood and blood-forming organs and certain disorders involving the immune mechanism: Secondary | ICD-10-CM | POA: Diagnosis not present

## 2018-07-27 DIAGNOSIS — I129 Hypertensive chronic kidney disease with stage 1 through stage 4 chronic kidney disease, or unspecified chronic kidney disease: Secondary | ICD-10-CM | POA: Diagnosis not present

## 2018-08-02 DIAGNOSIS — N184 Chronic kidney disease, stage 4 (severe): Secondary | ICD-10-CM | POA: Diagnosis not present

## 2018-08-02 DIAGNOSIS — I10 Essential (primary) hypertension: Secondary | ICD-10-CM | POA: Diagnosis not present

## 2018-08-02 DIAGNOSIS — R809 Proteinuria, unspecified: Secondary | ICD-10-CM | POA: Diagnosis not present

## 2019-07-29 DIAGNOSIS — N943 Premenstrual tension syndrome: Secondary | ICD-10-CM | POA: Insufficient documentation

## 2019-07-31 DIAGNOSIS — Z01818 Encounter for other preprocedural examination: Secondary | ICD-10-CM | POA: Insufficient documentation

## 2019-08-23 ENCOUNTER — Other Ambulatory Visit: Payer: Self-pay | Admitting: Family Medicine

## 2019-08-23 ENCOUNTER — Other Ambulatory Visit (HOSPITAL_COMMUNITY)
Admission: RE | Admit: 2019-08-23 | Discharge: 2019-08-23 | Disposition: A | Discharge status: Home / Self Care | Payer: 59 | Source: Ambulatory Visit | Attending: Family Medicine | Admitting: Family Medicine

## 2019-08-23 DIAGNOSIS — Z124 Encounter for screening for malignant neoplasm of cervix: Secondary | ICD-10-CM | POA: Insufficient documentation

## 2019-08-24 LAB — CYTOLOGY - PAP: Diagnosis: NEGATIVE

## 2019-10-10 ENCOUNTER — Other Ambulatory Visit (HOSPITAL_COMMUNITY): Payer: Self-pay | Admitting: *Deleted

## 2019-10-10 NOTE — Discharge Instructions (Signed)

## 2019-10-11 ENCOUNTER — Ambulatory Visit (HOSPITAL_COMMUNITY)
Admission: RE | Admit: 2019-10-11 | Discharge: 2019-10-11 | Disposition: A | Discharge status: Home / Self Care | Payer: 59 | Source: Ambulatory Visit | Attending: Internal Medicine | Admitting: Internal Medicine

## 2019-10-11 ENCOUNTER — Other Ambulatory Visit: Payer: Self-pay

## 2019-10-11 ENCOUNTER — Encounter: Payer: Self-pay | Admitting: Internal Medicine

## 2019-10-11 DIAGNOSIS — D631 Anemia in chronic kidney disease: Secondary | ICD-10-CM | POA: Diagnosis not present

## 2019-10-11 DIAGNOSIS — N185 Chronic kidney disease, stage 5: Secondary | ICD-10-CM | POA: Insufficient documentation

## 2019-10-11 DIAGNOSIS — Z862 Personal history of diseases of the blood and blood-forming organs and certain disorders involving the immune mechanism: Secondary | ICD-10-CM | POA: Insufficient documentation

## 2019-10-11 LAB — POCT HEMOGLOBIN-HEMACUE: Hemoglobin: 9.8 g/dL — ABNORMAL LOW (ref 12.0–15.0)

## 2019-10-11 MED ORDER — EPOETIN ALFA-EPBX 10000 UNIT/ML IJ SOLN
20000.0000 [IU] | Freq: Once | INTRAMUSCULAR | Status: AC
Start: 2019-10-11 — End: 2019-10-11

## 2019-10-11 MED ORDER — EPOETIN ALFA-EPBX 10000 UNIT/ML IJ SOLN
INTRAMUSCULAR | Status: AC
  Administered 2019-10-11: 20000 [IU] via SUBCUTANEOUS
  Filled 2019-10-11: qty 2

## 2019-11-08 ENCOUNTER — Other Ambulatory Visit: Payer: Self-pay

## 2019-11-08 ENCOUNTER — Ambulatory Visit (HOSPITAL_COMMUNITY)
Admission: RE | Admit: 2019-11-08 | Discharge: 2019-11-08 | Disposition: A | Discharge status: Home / Self Care | Payer: 59 | Source: Ambulatory Visit | Attending: Internal Medicine | Admitting: Internal Medicine

## 2019-11-08 VITALS — BP 153/89 | HR 76 | Temp 97.0°F | Resp 18

## 2019-11-08 DIAGNOSIS — Z862 Personal history of diseases of the blood and blood-forming organs and certain disorders involving the immune mechanism: Secondary | ICD-10-CM | POA: Diagnosis present

## 2019-11-08 DIAGNOSIS — N185 Chronic kidney disease, stage 5: Secondary | ICD-10-CM | POA: Insufficient documentation

## 2019-11-08 DIAGNOSIS — N189 Chronic kidney disease, unspecified: Secondary | ICD-10-CM | POA: Insufficient documentation

## 2019-11-08 LAB — IRON AND TIBC
Iron: 93 ug/dL (ref 28–170)
Saturation Ratios: 28 % (ref 10.4–31.8)
TIBC: 328 ug/dL (ref 250–450)
UIBC: 235 ug/dL

## 2019-11-08 LAB — POCT HEMOGLOBIN-HEMACUE: Hemoglobin: 10.6 g/dL — ABNORMAL LOW (ref 12.0–15.0)

## 2019-11-08 LAB — FERRITIN: Ferritin: 73 ng/mL (ref 11–307)

## 2019-11-08 MED ORDER — EPOETIN ALFA-EPBX 10000 UNIT/ML IJ SOLN: 20000.0000 [IU] | INTRAMUSCULAR | Status: DC

## 2019-11-08 MED ORDER — EPOETIN ALFA-EPBX 10000 UNIT/ML IJ SOLN
INTRAMUSCULAR | Status: AC
  Administered 2019-11-08: 20000 [IU]
  Filled 2019-11-08: qty 2

## 2019-12-06 ENCOUNTER — Ambulatory Visit (HOSPITAL_COMMUNITY)
Admission: RE | Admit: 2019-12-06 | Discharge: 2019-12-06 | Disposition: A | Discharge status: Home / Self Care | Payer: 59 | Source: Ambulatory Visit | Attending: Internal Medicine | Admitting: Internal Medicine

## 2019-12-06 ENCOUNTER — Other Ambulatory Visit: Payer: Self-pay

## 2019-12-06 VITALS — BP 169/91 | HR 79 | Temp 97.3°F | Resp 18

## 2019-12-06 DIAGNOSIS — Z862 Personal history of diseases of the blood and blood-forming organs and certain disorders involving the immune mechanism: Secondary | ICD-10-CM | POA: Diagnosis present

## 2019-12-06 DIAGNOSIS — N189 Chronic kidney disease, unspecified: Secondary | ICD-10-CM | POA: Insufficient documentation

## 2019-12-06 DIAGNOSIS — N185 Chronic kidney disease, stage 5: Secondary | ICD-10-CM

## 2019-12-06 LAB — IRON AND TIBC
Iron: 77 ug/dL (ref 28–170)
Saturation Ratios: 24 % (ref 10.4–31.8)
TIBC: 321 ug/dL (ref 250–450)
UIBC: 244 ug/dL

## 2019-12-06 LAB — FERRITIN: Ferritin: 68 ng/mL (ref 11–307)

## 2019-12-06 LAB — POCT HEMOGLOBIN-HEMACUE: Hemoglobin: 10.5 g/dL — ABNORMAL LOW (ref 12.0–15.0)

## 2019-12-06 MED ORDER — EPOETIN ALFA-EPBX 10000 UNIT/ML IJ SOLN
20000.0000 [IU] | INTRAMUSCULAR | Status: DC
  Administered 2019-12-06: 20000 [IU] via SUBCUTANEOUS

## 2019-12-06 MED ORDER — EPOETIN ALFA-EPBX 10000 UNIT/ML IJ SOLN
INTRAMUSCULAR | Status: AC
  Filled 2019-12-06: qty 2

## 2020-01-03 ENCOUNTER — Encounter (HOSPITAL_COMMUNITY): Payer: 59

## 2020-01-11 ENCOUNTER — Ambulatory Visit (HOSPITAL_COMMUNITY)
Admission: RE | Admit: 2020-01-11 | Discharge: 2020-01-11 | Disposition: A | Discharge status: Home / Self Care | Payer: 59 | Source: Ambulatory Visit | Attending: Internal Medicine | Admitting: Internal Medicine

## 2020-01-11 ENCOUNTER — Other Ambulatory Visit: Payer: Self-pay

## 2020-01-11 VITALS — BP 128/82 | HR 82 | Temp 98.0°F | Resp 18

## 2020-01-11 DIAGNOSIS — Z862 Personal history of diseases of the blood and blood-forming organs and certain disorders involving the immune mechanism: Secondary | ICD-10-CM | POA: Diagnosis present

## 2020-01-11 DIAGNOSIS — N189 Chronic kidney disease, unspecified: Secondary | ICD-10-CM | POA: Insufficient documentation

## 2020-01-11 DIAGNOSIS — N185 Chronic kidney disease, stage 5: Secondary | ICD-10-CM | POA: Insufficient documentation

## 2020-01-11 LAB — IRON AND TIBC
Iron: 120 ug/dL (ref 28–170)
Saturation Ratios: 37 % — ABNORMAL HIGH (ref 10.4–31.8)
TIBC: 321 ug/dL (ref 250–450)
UIBC: 201 ug/dL

## 2020-01-11 LAB — FERRITIN: Ferritin: 101 ng/mL (ref 11–307)

## 2020-01-11 LAB — POCT HEMOGLOBIN-HEMACUE: Hemoglobin: 10.9 g/dL — ABNORMAL LOW (ref 12.0–15.0)

## 2020-01-11 MED ORDER — EPOETIN ALFA-EPBX 10000 UNIT/ML IJ SOLN
INTRAMUSCULAR | Status: AC
  Administered 2020-01-11: 20000 [IU] via SUBCUTANEOUS
  Filled 2020-01-11: qty 2

## 2020-01-11 MED ORDER — EPOETIN ALFA-EPBX 10000 UNIT/ML IJ SOLN: 20000.0000 [IU] | INTRAMUSCULAR | Status: DC

## 2020-01-31 ENCOUNTER — Encounter (HOSPITAL_COMMUNITY): Payer: 59

## 2020-02-08 ENCOUNTER — Encounter (HOSPITAL_COMMUNITY): Payer: 59

## 2020-02-08 ENCOUNTER — Ambulatory Visit (HOSPITAL_COMMUNITY)
Admission: RE | Admit: 2020-02-08 | Discharge: 2020-02-08 | Disposition: A | Discharge status: Home / Self Care | Payer: 59 | Source: Ambulatory Visit | Attending: Internal Medicine | Admitting: Internal Medicine

## 2020-02-08 ENCOUNTER — Other Ambulatory Visit: Payer: Self-pay

## 2020-02-08 VITALS — BP 144/75 | HR 83 | Temp 97.7°F | Resp 18

## 2020-02-08 DIAGNOSIS — N185 Chronic kidney disease, stage 5: Secondary | ICD-10-CM

## 2020-02-08 DIAGNOSIS — N189 Chronic kidney disease, unspecified: Secondary | ICD-10-CM | POA: Diagnosis present

## 2020-02-08 DIAGNOSIS — Z862 Personal history of diseases of the blood and blood-forming organs and certain disorders involving the immune mechanism: Secondary | ICD-10-CM

## 2020-02-08 LAB — IRON AND TIBC
Iron: 96 ug/dL (ref 28–170)
Saturation Ratios: 32 % — ABNORMAL HIGH (ref 10.4–31.8)
TIBC: 302 ug/dL (ref 250–450)
UIBC: 206 ug/dL

## 2020-02-08 LAB — POCT HEMOGLOBIN-HEMACUE: Hemoglobin: 9.8 g/dL — ABNORMAL LOW (ref 12.0–15.0)

## 2020-02-08 LAB — FERRITIN: Ferritin: 93 ng/mL (ref 11–307)

## 2020-02-08 MED ORDER — EPOETIN ALFA-EPBX 10000 UNIT/ML IJ SOLN
20000.0000 [IU] | INTRAMUSCULAR | Status: DC
  Administered 2020-02-08: 20000 [IU] via SUBCUTANEOUS

## 2020-02-08 MED ORDER — EPOETIN ALFA-EPBX 10000 UNIT/ML IJ SOLN
INTRAMUSCULAR | Status: AC
  Filled 2020-02-08: qty 2

## 2020-03-07 ENCOUNTER — Inpatient Hospital Stay (HOSPITAL_COMMUNITY)
Admission: RE | Admit: 2020-03-07 | Discharge: 2020-03-07 | Disposition: A | Discharge status: Home / Self Care | Payer: 59 | Source: Ambulatory Visit | Attending: Internal Medicine | Admitting: Internal Medicine

## 2020-03-16 ENCOUNTER — Ambulatory Visit (HOSPITAL_COMMUNITY)
Admission: RE | Admit: 2020-03-16 | Discharge: 2020-03-16 | Disposition: A | Discharge status: Home / Self Care | Payer: 59 | Source: Ambulatory Visit | Attending: Internal Medicine | Admitting: Internal Medicine

## 2020-03-16 ENCOUNTER — Other Ambulatory Visit: Payer: Self-pay

## 2020-03-16 VITALS — BP 148/82 | HR 77 | Temp 98.5°F | Resp 18

## 2020-03-16 DIAGNOSIS — N189 Chronic kidney disease, unspecified: Secondary | ICD-10-CM | POA: Insufficient documentation

## 2020-03-16 DIAGNOSIS — N185 Chronic kidney disease, stage 5: Secondary | ICD-10-CM

## 2020-03-16 DIAGNOSIS — Z862 Personal history of diseases of the blood and blood-forming organs and certain disorders involving the immune mechanism: Secondary | ICD-10-CM

## 2020-03-16 LAB — RENAL FUNCTION PANEL
Albumin: 3.8 g/dL (ref 3.5–5.0)
Anion gap: 15 (ref 5–15)
BUN: 100 mg/dL — ABNORMAL HIGH (ref 6–20)
CO2: 25 mmol/L (ref 22–32)
Calcium: 9.1 mg/dL (ref 8.9–10.3)
Chloride: 91 mmol/L — ABNORMAL LOW (ref 98–111)
Creatinine, Ser: 10.79 mg/dL — ABNORMAL HIGH (ref 0.44–1.00)
GFR calc Af Amer: 4 mL/min — ABNORMAL LOW (ref >60)
GFR calc non Af Amer: 4 mL/min — ABNORMAL LOW (ref >60)
Glucose, Bld: 97 mg/dL (ref 70–99)
Phosphorus: 7.3 mg/dL — ABNORMAL HIGH (ref 2.5–4.6)
Potassium: 3.5 mmol/L (ref 3.5–5.1)
Sodium: 131 mmol/L — ABNORMAL LOW (ref 135–145)

## 2020-03-16 LAB — IRON AND TIBC
Iron: 113 ug/dL (ref 28–170)
Saturation Ratios: 35 % — ABNORMAL HIGH (ref 10.4–31.8)
TIBC: 326 ug/dL (ref 250–450)
UIBC: 213 ug/dL

## 2020-03-16 LAB — POCT HEMOGLOBIN-HEMACUE: Hemoglobin: 9.7 g/dL — ABNORMAL LOW (ref 12.0–15.0)

## 2020-03-16 LAB — FERRITIN: Ferritin: 141 ng/mL (ref 11–307)

## 2020-03-16 MED ORDER — EPOETIN ALFA-EPBX 2000 UNIT/ML IJ SOLN
INTRAMUSCULAR | Status: AC
  Administered 2020-03-16: 2000 [IU]
  Filled 2020-03-16: qty 1

## 2020-03-16 MED ORDER — EPOETIN ALFA-EPBX 3000 UNIT/ML IJ SOLN
INTRAMUSCULAR | Status: AC
  Administered 2020-03-16: 3000 [IU]
  Filled 2020-03-16: qty 1

## 2020-03-16 MED ORDER — EPOETIN ALFA-EPBX 10000 UNIT/ML IJ SOLN
INTRAMUSCULAR | Status: AC
  Filled 2020-03-16: qty 2

## 2020-03-16 MED ORDER — EPOETIN ALFA-EPBX 10000 UNIT/ML IJ SOLN
20000.0000 [IU] | INTRAMUSCULAR | Status: DC
  Administered 2020-03-16: 20000 [IU] via SUBCUTANEOUS

## 2020-03-17 LAB — PTH, INTACT AND CALCIUM
Calcium, Total (PTH): 8.9 mg/dL (ref 8.7–10.2)
PTH: 149 pg/mL — ABNORMAL HIGH (ref 15–65)

## 2020-04-04 ENCOUNTER — Encounter (HOSPITAL_COMMUNITY): Payer: 59

## 2020-04-11 ENCOUNTER — Encounter (HOSPITAL_COMMUNITY)
Admission: RE | Admit: 2020-04-11 | Discharge: 2020-04-11 | Disposition: A | Discharge status: Home / Self Care | Payer: 59 | Source: Ambulatory Visit | Attending: Internal Medicine | Admitting: Internal Medicine

## 2020-04-11 ENCOUNTER — Other Ambulatory Visit: Payer: Self-pay

## 2020-04-11 VITALS — BP 147/79 | HR 85 | Temp 97.6°F | Resp 18

## 2020-04-11 DIAGNOSIS — N189 Chronic kidney disease, unspecified: Secondary | ICD-10-CM | POA: Diagnosis present

## 2020-04-11 DIAGNOSIS — Z862 Personal history of diseases of the blood and blood-forming organs and certain disorders involving the immune mechanism: Secondary | ICD-10-CM

## 2020-04-11 DIAGNOSIS — N185 Chronic kidney disease, stage 5: Secondary | ICD-10-CM | POA: Diagnosis not present

## 2020-04-11 LAB — BASIC METABOLIC PANEL
Anion gap: 14 (ref 5–15)
BUN: 72 mg/dL — ABNORMAL HIGH (ref 6–20)
CO2: 23 mmol/L (ref 22–32)
Calcium: 9.2 mg/dL (ref 8.9–10.3)
Chloride: 101 mmol/L (ref 98–111)
Creatinine, Ser: 10.62 mg/dL — ABNORMAL HIGH (ref 0.44–1.00)
GFR, Estimated: 4 mL/min — ABNORMAL LOW (ref >60)
Glucose, Bld: 96 mg/dL (ref 70–99)
Potassium: 4.4 mmol/L (ref 3.5–5.1)
Sodium: 138 mmol/L (ref 135–145)

## 2020-04-11 LAB — FERRITIN: Ferritin: 102 ng/mL (ref 11–307)

## 2020-04-11 LAB — IRON AND TIBC
Iron: 88 ug/dL (ref 28–170)
Saturation Ratios: 30 % (ref 10.4–31.8)
TIBC: 290 ug/dL (ref 250–450)
UIBC: 202 ug/dL

## 2020-04-11 LAB — POCT HEMOGLOBIN-HEMACUE: Hemoglobin: 9.6 g/dL — ABNORMAL LOW (ref 12.0–15.0)

## 2020-04-11 MED ORDER — EPOETIN ALFA-EPBX 10000 UNIT/ML IJ SOLN
INTRAMUSCULAR | Status: AC
  Administered 2020-04-11: 20000 [IU] via SUBCUTANEOUS
  Filled 2020-04-11: qty 2

## 2020-04-11 MED ORDER — EPOETIN ALFA-EPBX 40000 UNIT/ML IJ SOLN: 25000.0000 [IU] | INTRAMUSCULAR | Status: DC

## 2020-04-11 MED ORDER — EPOETIN ALFA-EPBX 2000 UNIT/ML IJ SOLN
INTRAMUSCULAR | Status: AC
  Administered 2020-04-11: 2000 [IU] via SUBCUTANEOUS
  Filled 2020-04-11: qty 1

## 2020-04-11 MED ORDER — EPOETIN ALFA-EPBX 3000 UNIT/ML IJ SOLN
INTRAMUSCULAR | Status: AC
  Administered 2020-04-11: 3000 [IU] via SUBCUTANEOUS
  Filled 2020-04-11: qty 1

## 2020-04-13 ENCOUNTER — Encounter (HOSPITAL_COMMUNITY): Payer: 59

## 2020-04-14 LAB — PTH, INTACT AND CALCIUM
Calcium, Total (PTH): 9.5 mg/dL (ref 8.7–10.2)
PTH: 98 pg/mL — ABNORMAL HIGH (ref 15–65)

## 2020-05-02 DIAGNOSIS — N6019 Diffuse cystic mastopathy of unspecified breast: Secondary | ICD-10-CM | POA: Insufficient documentation

## 2020-05-09 ENCOUNTER — Ambulatory Visit (HOSPITAL_COMMUNITY)
Admission: RE | Admit: 2020-05-09 | Discharge: 2020-05-09 | Disposition: A | Discharge status: Home / Self Care | Payer: 59 | Source: Ambulatory Visit | Attending: Internal Medicine | Admitting: Internal Medicine

## 2020-05-09 ENCOUNTER — Other Ambulatory Visit: Payer: Self-pay

## 2020-05-09 VITALS — BP 148/80 | HR 85 | Temp 97.7°F | Resp 18

## 2020-05-09 DIAGNOSIS — N189 Chronic kidney disease, unspecified: Secondary | ICD-10-CM | POA: Diagnosis present

## 2020-05-09 DIAGNOSIS — N185 Chronic kidney disease, stage 5: Secondary | ICD-10-CM | POA: Insufficient documentation

## 2020-05-09 DIAGNOSIS — Z862 Personal history of diseases of the blood and blood-forming organs and certain disorders involving the immune mechanism: Secondary | ICD-10-CM

## 2020-05-09 LAB — POCT HEMOGLOBIN-HEMACUE: Hemoglobin: 9.1 g/dL — ABNORMAL LOW (ref 12.0–15.0)

## 2020-05-09 LAB — IRON AND TIBC
Iron: 59 ug/dL (ref 28–170)
Saturation Ratios: 22 % (ref 10.4–31.8)
TIBC: 269 ug/dL (ref 250–450)
UIBC: 210 ug/dL

## 2020-05-09 LAB — FERRITIN: Ferritin: 131 ng/mL (ref 11–307)

## 2020-05-09 MED ORDER — EPOETIN ALFA-EPBX 3000 UNIT/ML IJ SOLN
INTRAMUSCULAR | Status: AC
  Administered 2020-05-09: 3000 [IU] via SUBCUTANEOUS
  Filled 2020-05-09: qty 1

## 2020-05-09 MED ORDER — EPOETIN ALFA-EPBX 10000 UNIT/ML IJ SOLN
INTRAMUSCULAR | Status: AC
  Administered 2020-05-09: 20000 [IU] via SUBCUTANEOUS
  Filled 2020-05-09: qty 1

## 2020-05-09 MED ORDER — EPOETIN ALFA-EPBX 40000 UNIT/ML IJ SOLN: 25000.0000 [IU] | INTRAMUSCULAR | Status: DC

## 2020-05-09 MED ORDER — EPOETIN ALFA-EPBX 2000 UNIT/ML IJ SOLN
INTRAMUSCULAR | Status: AC
  Administered 2020-05-09: 2000 [IU] via SUBCUTANEOUS
  Filled 2020-05-09: qty 1

## 2020-05-18 DIAGNOSIS — N186 End stage renal disease: Secondary | ICD-10-CM | POA: Insufficient documentation

## 2020-05-30 HISTORY — PX: INSERTION OF DIALYSIS CATHETER: SHX1324

## 2020-06-05 ENCOUNTER — Other Ambulatory Visit (HOSPITAL_COMMUNITY): Payer: Self-pay

## 2020-06-06 ENCOUNTER — Encounter (HOSPITAL_COMMUNITY): Payer: 59

## 2020-06-06 ENCOUNTER — Encounter (HOSPITAL_COMMUNITY): Payer: Self-pay

## 2020-07-18 ENCOUNTER — Other Ambulatory Visit: Payer: Self-pay

## 2020-07-18 DIAGNOSIS — N185 Chronic kidney disease, stage 5: Secondary | ICD-10-CM

## 2020-08-01 ENCOUNTER — Encounter (HOSPITAL_COMMUNITY): Payer: Self-pay

## 2020-08-01 ENCOUNTER — Other Ambulatory Visit: Payer: Self-pay

## 2020-08-01 ENCOUNTER — Emergency Department (HOSPITAL_COMMUNITY)
Admission: EM | Admit: 2020-08-01 | Discharge: 2020-08-01 | Disposition: A | Discharge status: Home / Self Care | Payer: 59 | Attending: Emergency Medicine | Admitting: Emergency Medicine

## 2020-08-01 DIAGNOSIS — Z992 Dependence on renal dialysis: Secondary | ICD-10-CM | POA: Insufficient documentation

## 2020-08-01 DIAGNOSIS — R55 Syncope and collapse: Secondary | ICD-10-CM | POA: Insufficient documentation

## 2020-08-01 DIAGNOSIS — Z5321 Procedure and treatment not carried out due to patient leaving prior to being seen by health care provider: Secondary | ICD-10-CM | POA: Diagnosis not present

## 2020-08-01 LAB — BASIC METABOLIC PANEL WITH GFR
Anion gap: 15 (ref 5–15)
BUN: 11 mg/dL (ref 6–20)
CO2: 29 mmol/L (ref 22–32)
Calcium: 9 mg/dL (ref 8.9–10.3)
Chloride: 92 mmol/L — ABNORMAL LOW (ref 98–111)
Creatinine, Ser: 4.14 mg/dL — ABNORMAL HIGH (ref 0.44–1.00)
GFR, Estimated: 12 mL/min — ABNORMAL LOW
Glucose, Bld: 120 mg/dL — ABNORMAL HIGH (ref 70–99)
Potassium: 4.4 mmol/L (ref 3.5–5.1)
Sodium: 136 mmol/L (ref 135–145)

## 2020-08-01 LAB — CBC
HCT: 36.5 % (ref 36.0–46.0)
Hemoglobin: 11.6 g/dL — ABNORMAL LOW (ref 12.0–15.0)
MCH: 30 pg (ref 26.0–34.0)
MCHC: 31.8 g/dL (ref 30.0–36.0)
MCV: 94.3 fL (ref 80.0–100.0)
Platelets: 333 10*3/uL (ref 150–400)
RBC: 3.87 MIL/uL (ref 3.87–5.11)
RDW: 13.8 % (ref 11.5–15.5)
WBC: 6.3 10*3/uL (ref 4.0–10.5)
nRBC: 0 % (ref 0.0–0.2)

## 2020-08-01 NOTE — ED Triage Notes (Signed)
Patient arrived by [facility] from dialysis center after syncopal event. Patient was finished and stood up and then passed out, alert and oriented and denies pain. States that she just feels tired

## 2020-08-07 ENCOUNTER — Other Ambulatory Visit (HOSPITAL_COMMUNITY): Payer: 59

## 2020-08-07 ENCOUNTER — Encounter: Payer: 59 | Admitting: Vascular Surgery

## 2020-08-07 ENCOUNTER — Encounter (HOSPITAL_COMMUNITY): Payer: 59

## 2020-08-08 ENCOUNTER — Other Ambulatory Visit: Payer: Self-pay

## 2020-08-08 ENCOUNTER — Ambulatory Visit (HOSPITAL_COMMUNITY)
Admission: RE | Admit: 2020-08-08 | Discharge: 2020-08-08 | Disposition: A | Discharge status: Home / Self Care | Payer: 59 | Source: Ambulatory Visit | Attending: Vascular Surgery | Admitting: Vascular Surgery

## 2020-08-08 ENCOUNTER — Ambulatory Visit (INDEPENDENT_AMBULATORY_CARE_PROVIDER_SITE_OTHER)
Admission: RE | Admit: 2020-08-08 | Discharge: 2020-08-08 | Disposition: A | Discharge status: Home / Self Care | Payer: 59 | Source: Ambulatory Visit | Attending: Vascular Surgery | Admitting: Vascular Surgery

## 2020-08-08 ENCOUNTER — Encounter: Payer: Self-pay | Admitting: Vascular Surgery

## 2020-08-08 ENCOUNTER — Ambulatory Visit (INDEPENDENT_AMBULATORY_CARE_PROVIDER_SITE_OTHER): Payer: 59 | Admitting: Vascular Surgery

## 2020-08-08 VITALS — BP 156/103 | HR 82 | Temp 97.9°F | Resp 20 | Ht 66.0 in | Wt 196.8 lb

## 2020-08-08 DIAGNOSIS — N185 Chronic kidney disease, stage 5: Secondary | ICD-10-CM | POA: Diagnosis present

## 2020-08-08 DIAGNOSIS — Z992 Dependence on renal dialysis: Secondary | ICD-10-CM | POA: Diagnosis not present

## 2020-08-08 DIAGNOSIS — N186 End stage renal disease: Secondary | ICD-10-CM | POA: Diagnosis not present

## 2020-08-08 NOTE — Progress Notes (Signed)
REASON FOR CONSULT:    To evaluate for hemodialysis access.  The consult is requested by Dr. Moshe Cipro.  ASSESSMENT & PLAN:   END-STAGE RENAL DISEASE: This patient currently has a functioning right IJ tunneled dialysis catheter.  She is being considered for peritoneal dialysis.  She has some concerns about why she would require access in her arm.  I have explained that the nephrologist often like to have a backup in case she were to get peritonitis.  Based on her vein map I think her only real option for a fistula would be a right brachiocephalic fistula.  If this were not possible we would place an AV graft.  Given that her catheter is on the right I think she is at slightly increased risk for swelling in the right arm given that the catheter can sometimes create an outflow obstruction.  When she has had a chance to talk to Dr. Clover Mealy about this she will call to schedule a right brachiocephalic AV fistula.  She dialyzes on Monday Wednesdays and Fridays and I could do this on a Tuesday.  I have explained the indications for placement of an AV fistula or AV graft. I've explained that if at all possible we will place an AV fistula.  I have reviewed the risks of placement of an AV fistula including but not limited to: failure of the fistula to mature, need for subsequent interventions, and thrombosis. In addition I have reviewed the potential complications of placement of an AV graft. These risks include, but are not limited to, graft thrombosis, graft infection, wound healing problems, bleeding, arm swelling, and steal syndrome. All the patient's questions were answered and they are agreeable to proceed with surgery.   Deitra Mayo, MD Office: [phone]   HPI:   Meredith Reed is a pleasant 55 y.o. female, who was sent for evaluation for new access.  I have reviewed the records from the referring office.  We were asked to place a fistula.  If a fistula were not possible we were asked  to proceed with placement of an AV graft.  The patient currently has a catheter.  On my history, the patient denies any recent uremic symptoms.  She is not sure why she has end-stage renal disease.  She dialyzes on Monday Wednesdays and Fridays.  She is right-handed.  She does not have a pacemaker.  She is not on any blood thinners.  She is had her catheter for about 2 months in the right IJ.  She denies nausea vomiting fatigue palpitations or anorexia.  Past Medical History:  Diagnosis Date  . Arthritis    knees  . Breast density   . Chronic kidney disease   . Hypertension     Family History  Problem Relation Age of Onset  . Emphysema Father   . Hypertension Sister        x2  . Deep vein thrombosis Sister   . Stroke Sister   . Deep vein thrombosis Paternal Grandmother   . Diabetes Paternal Grandmother   . Cancer Paternal Grandfather        ? type  . Diabetes Paternal Grandfather   . Diabetes Mother   . Diabetes Maternal Aunt   . Diabetes Maternal Uncle   . Diabetes Maternal Uncle     SOCIAL HISTORY: Social History   Socioeconomic History  . Marital status: Married    Spouse name: Not on file  . Number of children: Not on file  . Years  of education: Not on file  . Highest education level: Not on file  Occupational History  . Not on file  Tobacco Use  . Smoking status: Never Smoker  . Smokeless tobacco: Never Used  Vaping Use  . Vaping Use: Never used  Substance and Sexual Activity  . Alcohol use: No  . Drug use: No  . Sexual activity: Yes    Birth control/protection: Other-see comments    Comment: spouse had vas  Other Topics Concern  . Not on file  Social History Narrative  . Not on file   Social Determinants of Health   Financial Resource Strain: Not on file  Food Insecurity: Not on file  Transportation Needs: Not on file  Physical Activity: Not on file  Stress: Not on file  Social Connections: Not on file  Intimate Partner Violence: Not on file     No Known Allergies  Current Outpatient Medications  Medication Sig Dispense Refill  . amLODipine (NORVASC) 10 MG tablet Take 10 mg by mouth daily.    . cholecalciferol (VITAMIN D) 1000 UNITS tablet Take 1,000 Units by mouth daily.    . DULoxetine (CYMBALTA) 60 MG capsule Take 120 mg by mouth every evening.    Marland Kitchen losartan-hydrochlorothiazide (HYZAAR) 100-25 MG per tablet Take 1 tablet by mouth daily.    . magnesium gluconate (MAGONATE) 500 MG tablet Take 500 mg by mouth daily as needed (night time leg cramps).     . Multiple Vitamins-Minerals (MULTIVITAMIN WITH MINERALS) tablet Take 1 tablet by mouth daily.    . vitamin C (ASCORBIC ACID) 500 MG tablet Take 500 mg by mouth daily.     No current facility-administered medications for this visit.    REVIEW OF SYSTEMS:  '[X]'$  denotes positive finding, '[ ]'$  denotes negative finding Cardiac  Comments:  Chest pain or chest pressure:    Shortness of breath upon exertion:    Short of breath when lying flat:    Irregular heart rhythm:        Vascular    Pain in calf, thigh, or hip brought on by ambulation:    Pain in feet at night that wakes you up from your sleep:     Blood clot in your veins:    Leg swelling:         Pulmonary    Oxygen at home:    Productive cough:     Wheezing:         Neurologic    Sudden weakness in arms or legs:     Sudden numbness in arms or legs:     Sudden onset of difficulty speaking or slurred speech:    Temporary loss of vision in one eye:     Problems with dizziness:         Gastrointestinal    Blood in stool:     Vomited blood:         Genitourinary    Burning when urinating:     Blood in urine:        Psychiatric    Major depression:         Hematologic    Bleeding problems:    Problems with blood clotting too easily:        Skin    Rashes or ulcers:        Constitutional    Fever or chills:     PHYSICAL EXAM:   Vitals:   08/08/20 0850  BP: (!) 156/103  Pulse: 82  Resp:  20   Temp: 97.9 F (36.6 C)  SpO2: 99%  Weight: 196 lb 12.8 oz (89.3 kg)  Height: '5\' 6"'$  (1.676 m)    GENERAL: The patient is a well-nourished female, in no acute distress. The vital signs are documented above. CARDIAC: There is a regular rate and rhythm.  VASCULAR: I do not detect carotid bruits. She has palpable radial pulses bilaterally. PULMONARY: There is good air exchange bilaterally without wheezing or rales. ABDOMEN: Soft and non-tender with normal pitched bowel sounds.  MUSCULOSKELETAL: There are no major deformities or cyanosis. NEUROLOGIC: No focal weakness or paresthesias are detected. SKIN: There are no ulcers or rashes noted. PSYCHIATRIC: The patient has a normal affect.  DATA:    UPPER EXTREMITY VEIN MAP: I have independently interpreted her upper extremity vein map.  On the right side the forearm cephalic vein is very small.  The upper arm cephalic vein is marginal in size.  The basilic vein is marginal in size.  On the left side the forearm and upper arm cephalic vein are small.  The basilic vein is small.  UPPER EXTREMITY ARTERIAL DUPLEX: I have independently interpreted her upper extremity arterial duplex scan.  On the right side there is a triphasic radial and ulnar waveform.  The brachial artery measures 0.45 cm in diameter.  On the left side, there is a triphasic radial and ulnar waveform the brachial artery measures 0.43 cm in diameter.

## 2020-10-23 ENCOUNTER — Telehealth: Payer: Self-pay

## 2020-10-23 NOTE — Telephone Encounter (Signed)
NOTES ON FILE FROM [facility] [phone], SENT REFERRAL TO SCHEDULING

## 2020-10-27 ENCOUNTER — Encounter (HOSPITAL_COMMUNITY): Payer: Self-pay | Admitting: Emergency Medicine

## 2020-10-27 ENCOUNTER — Emergency Department (HOSPITAL_COMMUNITY): Payer: 59

## 2020-10-27 ENCOUNTER — Other Ambulatory Visit: Payer: Self-pay

## 2020-10-27 ENCOUNTER — Telehealth: Payer: Self-pay

## 2020-10-27 ENCOUNTER — Inpatient Hospital Stay (HOSPITAL_COMMUNITY)
Admission: EM | Admit: 2020-10-27 | Discharge: 2020-10-31 | DRG: 291 | Disposition: A | Discharge status: Home / Self Care | Payer: 59 | Attending: Internal Medicine | Admitting: Internal Medicine

## 2020-10-27 ENCOUNTER — Inpatient Hospital Stay (HOSPITAL_COMMUNITY): Payer: 59

## 2020-10-27 DIAGNOSIS — N186 End stage renal disease: Secondary | ICD-10-CM | POA: Diagnosis present

## 2020-10-27 DIAGNOSIS — J90 Pleural effusion, not elsewhere classified: Secondary | ICD-10-CM | POA: Diagnosis not present

## 2020-10-27 DIAGNOSIS — Z7682 Awaiting organ transplant status: Secondary | ICD-10-CM | POA: Diagnosis not present

## 2020-10-27 DIAGNOSIS — N028 Recurrent and persistent hematuria with other morphologic changes: Secondary | ICD-10-CM | POA: Diagnosis present

## 2020-10-27 DIAGNOSIS — R911 Solitary pulmonary nodule: Secondary | ICD-10-CM | POA: Diagnosis present

## 2020-10-27 DIAGNOSIS — E8889 Other specified metabolic disorders: Secondary | ICD-10-CM | POA: Diagnosis present

## 2020-10-27 DIAGNOSIS — D631 Anemia in chronic kidney disease: Secondary | ICD-10-CM | POA: Diagnosis present

## 2020-10-27 DIAGNOSIS — Z79899 Other long term (current) drug therapy: Secondary | ICD-10-CM

## 2020-10-27 DIAGNOSIS — I132 Hypertensive heart and chronic kidney disease with heart failure and with stage 5 chronic kidney disease, or end stage renal disease: Secondary | ICD-10-CM | POA: Diagnosis present

## 2020-10-27 DIAGNOSIS — I313 Pericardial effusion (noninflammatory): Secondary | ICD-10-CM | POA: Diagnosis not present

## 2020-10-27 DIAGNOSIS — N2581 Secondary hyperparathyroidism of renal origin: Secondary | ICD-10-CM | POA: Diagnosis present

## 2020-10-27 DIAGNOSIS — Z992 Dependence on renal dialysis: Secondary | ICD-10-CM

## 2020-10-27 DIAGNOSIS — Z20822 Contact with and (suspected) exposure to covid-19: Secondary | ICD-10-CM | POA: Diagnosis present

## 2020-10-27 DIAGNOSIS — I5021 Acute systolic (congestive) heart failure: Secondary | ICD-10-CM | POA: Diagnosis present

## 2020-10-27 DIAGNOSIS — R0602 Shortness of breath: Secondary | ICD-10-CM | POA: Diagnosis present

## 2020-10-27 DIAGNOSIS — I509 Heart failure, unspecified: Secondary | ICD-10-CM | POA: Insufficient documentation

## 2020-10-27 DIAGNOSIS — I10 Essential (primary) hypertension: Secondary | ICD-10-CM | POA: Diagnosis not present

## 2020-10-27 LAB — CBC WITH DIFFERENTIAL/PLATELET
Abs Immature Granulocytes: 0.02 10*3/uL (ref 0.00–0.07)
Basophils Absolute: 0 10*3/uL (ref 0.0–0.1)
Basophils Relative: 1 %
Eosinophils Absolute: 0 10*3/uL (ref 0.0–0.5)
Eosinophils Relative: 1 %
HCT: 29.1 % — ABNORMAL LOW (ref 36.0–46.0)
Hemoglobin: 9.3 g/dL — ABNORMAL LOW (ref 12.0–15.0)
Immature Granulocytes: 0 %
Lymphocytes Relative: 21 %
Lymphs Abs: 1.3 10*3/uL (ref 0.7–4.0)
MCH: 29.3 pg (ref 26.0–34.0)
MCHC: 32 g/dL (ref 30.0–36.0)
MCV: 91.8 fL (ref 80.0–100.0)
Monocytes Absolute: 0.9 10*3/uL (ref 0.1–1.0)
Monocytes Relative: 14 %
Neutro Abs: 4.1 10*3/uL (ref 1.7–7.7)
Neutrophils Relative %: 63 %
Platelets: 177 10*3/uL (ref 150–400)
RBC: 3.17 MIL/uL — ABNORMAL LOW (ref 3.87–5.11)
RDW: 14.8 % (ref 11.5–15.5)
WBC: 6.4 10*3/uL (ref 4.0–10.5)
nRBC: 0 % (ref 0.0–0.2)

## 2020-10-27 LAB — COMPREHENSIVE METABOLIC PANEL
ALT: 24 U/L (ref 0–44)
ALT: 23 U/L (ref 0–44)
AST: 21 U/L (ref 15–41)
AST: 22 U/L (ref 15–41)
Albumin: 2.8 g/dL — ABNORMAL LOW (ref 3.5–5.0)
Albumin: 2.7 g/dL — ABNORMAL LOW (ref 3.5–5.0)
Alkaline Phosphatase: 73 U/L (ref 38–126)
Alkaline Phosphatase: 77 U/L (ref 38–126)
Anion gap: 12 (ref 5–15)
Anion gap: 9 (ref 5–15)
BUN: 18 mg/dL (ref 6–20)
BUN: 19 mg/dL (ref 6–20)
CO2: 29 mmol/L (ref 22–32)
CO2: 31 mmol/L (ref 22–32)
Calcium: 9.5 mg/dL (ref 8.9–10.3)
Calcium: 9.3 mg/dL (ref 8.9–10.3)
Chloride: 94 mmol/L — ABNORMAL LOW (ref 98–111)
Chloride: 94 mmol/L — ABNORMAL LOW (ref 98–111)
Creatinine, Ser: 5.75 mg/dL — ABNORMAL HIGH (ref 0.44–1.00)
Creatinine, Ser: 6.21 mg/dL — ABNORMAL HIGH (ref 0.44–1.00)
GFR, Estimated: 8 mL/min — ABNORMAL LOW (ref >60)
GFR, Estimated: 7 mL/min — ABNORMAL LOW (ref >60)
Glucose, Bld: 91 mg/dL (ref 70–99)
Glucose, Bld: 128 mg/dL — ABNORMAL HIGH (ref 70–99)
Potassium: 3.9 mmol/L (ref 3.5–5.1)
Potassium: 3.7 mmol/L (ref 3.5–5.1)
Sodium: 135 mmol/L (ref 135–145)
Sodium: 134 mmol/L — ABNORMAL LOW (ref 135–145)
Total Bilirubin: 1.1 mg/dL (ref 0.3–1.2)
Total Bilirubin: 0.8 mg/dL (ref 0.3–1.2)
Total Protein: 6 g/dL — ABNORMAL LOW (ref 6.5–8.1)
Total Protein: 6 g/dL — ABNORMAL LOW (ref 6.5–8.1)

## 2020-10-27 LAB — RESP PANEL BY RT-PCR (FLU A&B, COVID) ARPGX2
Influenza A by PCR: NEGATIVE
Influenza B by PCR: NEGATIVE
SARS Coronavirus 2 by RT PCR: NEGATIVE

## 2020-10-27 LAB — TSH: TSH: 1.941 u[IU]/mL (ref 0.350–4.500)

## 2020-10-27 LAB — TROPONIN I (HIGH SENSITIVITY)
Troponin I (High Sensitivity): 25 ng/L — ABNORMAL HIGH (ref <18)
Troponin I (High Sensitivity): 26 ng/L — ABNORMAL HIGH (ref <18)

## 2020-10-27 LAB — PROTIME-INR
INR: 1.1 (ref 0.8–1.2)
Prothrombin Time: 13.8 seconds (ref 11.4–15.2)

## 2020-10-27 LAB — HIV ANTIBODY (ROUTINE TESTING W REFLEX): HIV Screen 4th Generation wRfx: NONREACTIVE

## 2020-10-27 LAB — APTT: aPTT: 34 seconds (ref 24–36)

## 2020-10-27 MED ORDER — LABETALOL HCL 200 MG PO TABS
200.0000 mg | ORAL_TABLET | Freq: Two times a day (BID) | ORAL | Status: DC
  Administered 2020-10-28 – 2020-10-31 (×8): 200 mg via ORAL
  Filled 2020-10-27 (×8): qty 1

## 2020-10-27 MED ORDER — ACETAMINOPHEN 325 MG PO TABS: 650.0000 mg | ORAL_TABLET | Freq: Four times a day (QID) | ORAL | Status: DC | PRN

## 2020-10-27 MED ORDER — HEPARIN SODIUM (PORCINE) 1000 UNIT/ML IJ SOLN
INTRAMUSCULAR | Status: AC
  Filled 2020-10-27: qty 4

## 2020-10-27 MED ORDER — HEPARIN SODIUM (PORCINE) 5000 UNIT/ML IJ SOLN
5000.0000 [IU] | Freq: Three times a day (TID) | INTRAMUSCULAR | Status: DC
  Administered 2020-10-27 – 2020-10-30 (×10): 5000 [IU] via SUBCUTANEOUS
  Filled 2020-10-27 (×10): qty 1

## 2020-10-27 MED ORDER — CHLORHEXIDINE GLUCONATE CLOTH 2 % EX PADS
6.0000 | MEDICATED_PAD | Freq: Every day | CUTANEOUS | Status: DC
  Administered 2020-10-28: 6 via TOPICAL

## 2020-10-27 MED ORDER — ACETAMINOPHEN 650 MG RE SUPP: 650.0000 mg | Freq: Four times a day (QID) | RECTAL | Status: DC | PRN

## 2020-10-27 MED ORDER — LOSARTAN POTASSIUM 50 MG PO TABS
100.0000 mg | ORAL_TABLET | Freq: Every evening | ORAL | Status: DC
  Administered 2020-10-27 – 2020-10-30 (×4): 100 mg via ORAL
  Filled 2020-10-27 (×4): qty 2

## 2020-10-27 MED ORDER — SENNOSIDES-DOCUSATE SODIUM 8.6-50 MG PO TABS: 1.0000 | ORAL_TABLET | Freq: Every evening | ORAL | Status: DC | PRN

## 2020-10-27 MED ORDER — AMLODIPINE BESYLATE 10 MG PO TABS
10.0000 mg | ORAL_TABLET | Freq: Every day | ORAL | Status: DC
  Administered 2020-10-27 – 2020-10-31 (×5): 10 mg via ORAL
  Filled 2020-10-27: qty 2
  Filled 2020-10-27 (×4): qty 1

## 2020-10-27 NOTE — Progress Notes (Signed)
Patient off unit to HD

## 2020-10-27 NOTE — Progress Notes (Signed)
Patient arrived to room 5C16 in  NAD, VS stable and patient free from pain. Patient oriented to room and call bell in reach. Husband aware of admission to unit.

## 2020-10-27 NOTE — Consult Note (Signed)
NAME:  Meredith Reed, MRN:  [id number], DOB:  1965/12/07, LOS: 0 ADMISSION DATE:  10/27/2020, CONSULTATION DATE: 10/27/2020 REFERRING MD: Dr. Tamera Punt, CHIEF COMPLAINT: Shortness of breath  History of Present Illness:   This is a 55 year old female, past medical history of chronic kidney disease, IgA nephropathy was on immunosuppression previously, initially started on peritoneal dialysis had bacterial peritonitis, still has the catheter in place no longer using peritoneal dialysis.  Now has a right tunneled catheter subclavian undergoing HD.  Was recently seen at [facility] on 10/22/2020.  Patient had complaints of shortness of breath.  Patient was found to have bilateral pleural effusion.  Patient underwent an radiology ultrasound-guided thoracentesis with 1050cc fluid results revealed white blood cell count 842 pleural fluid analysis with 21% monocytes, 3% eosinophils.  Patient was discharged home.  Patient presents today to the emergency department here at [facility] with shortness of breath.  She is not requiring any oxygen she does have shortness of breath and difficulty taking a full inspiratory effort.  This brought her to the hospital for further evaluation.  Chest x-ray today reveals bilateral small pleural effusion.  Also has fluid in the pleural space as seen on abdominal CT which completed in the ER as well.  Pertinent  Medical History   Past Medical History:  Diagnosis Date  . Arthritis    knees  . Breast density   . Chronic kidney disease   . Hypertension      Significant Hospital Events: Including procedures, antibiotic start and stop dates in addition to other pertinent events   . ED evaluation . Pulmonary critical care consultation 10/27/2020  Interim History / Subjective:  Per HPI  Objective   Blood pressure (!) 171/95, pulse 93, temperature 99 F (37.2 C), temperature source Oral, resp. rate 18, last menstrual period 07/28/2012, SpO2 96 %.        No intake or output data in the 24 hours ending 10/27/20 1540 There were no vitals filed for this visit.  Examination: General: Middle-aged female resting in bed no distress does have shortness of breath with minimal exertion HENT: Tracking appropriately, NCAT Lungs: Diminished breath sounds in the right base compared to the left. Cardiovascular: Regular rhythm S1-S2,  Abdomen: Soft nontender nondistended she does have a right-sided peritoneal catheter Extremities: No significant edema Neuro: Alert oriented follow commands no focal deficit GU: Deferred  Labs/imaging that I havepersonally reviewed  (right click and "Reselect all SmartList Selections" daily)   Sodium 135 Potassium 3.9 Creatinine 5.57 White blood cell count 6.4 Hemoglobin 9.3 Platelets 177  Resolved Hospital Problem list     Assessment & Plan:   Recurrent bilateral pleural effusion Volume overload CKD, ESRD on IHD History of IgA nephropathy, off immunosuppression on transplant list  Plan: I think she should start by having a repeat session of dialysis. Try to get her to a new dry weight established. Discussed with nephrology. Bedside ultrasound completed today in the emergency room for further evaluation of the pleural effusion.  Both sides have free-flowing fluid.  There is no evidence of loculation. At this time I see no indication for thoracentesis. Would prefer her to receive dialysis before considering thoracentesis for therapeutic drainage. Its likely to just return on its own. If patient has reaccumulation of fluid quickly or becomes more respiratory distress or has increasing oxygen requirement can consider thoracentesis. Additional work-up for evaluation of bilateral pleural effusions should include echocardiogram. Would consider obtaining echocardiogram however we will leave this  decision to patient's admitting service.  We appreciate consultation.  Do not hesitate to call with any  questions.   Labs   CBC: Recent Labs  Lab 10/27/20 1123  WBC 6.4  NEUTROABS 4.1  HGB 9.3*  HCT 29.1*  MCV 91.8  PLT [id number]    Basic Metabolic Panel: Recent Labs  Lab 10/27/20 1123  NA 135  K 3.9  CL 94*  CO2 29  GLUCOSE 91  BUN 18  CREATININE 5.75*  CALCIUM 9.5   GFR: CrCl cannot be calculated (Unknown ideal weight.). Recent Labs  Lab 10/27/20 1123  WBC 6.4    Liver Function Tests: Recent Labs  Lab 10/27/20 1123  AST 21  ALT 24  ALKPHOS 73  BILITOT 1.1  PROT 6.0*  ALBUMIN 2.8*   No results for input(s): LIPASE, AMYLASE in the last 168 hours. No results for input(s): AMMONIA in the last 168 hours.  ABG No results found for: PHART, PCO2ART, PO2ART, HCO3, TCO2, ACIDBASEDEF, O2SAT   Coagulation Profile: No results for input(s): INR, PROTIME in the last 168 hours.  Cardiac Enzymes: No results for input(s): CKTOTAL, CKMB, CKMBINDEX, TROPONINI in the last 168 hours.  HbA1C: No results found for: HGBA1C  CBG: No results for input(s): GLUCAP in the last 168 hours.  Review of Systems:   Review of Systems  Constitutional: Negative for chills, fever, malaise/fatigue and weight loss.  HENT: Negative for hearing loss, sore throat and tinnitus.   Eyes: Negative for blurred vision and double vision.  Respiratory: Positive for shortness of breath. Negative for cough, hemoptysis, sputum production, wheezing and stridor.   Cardiovascular: Negative for chest pain, palpitations, orthopnea, leg swelling and PND.  Gastrointestinal: Negative for abdominal pain, constipation, diarrhea, heartburn, nausea and vomiting.  Genitourinary: Negative for dysuria, hematuria and urgency.  Musculoskeletal: Negative for joint pain and myalgias.  Skin: Negative for itching and rash.  Neurological: Negative for dizziness, tingling, weakness and headaches.  Endo/Heme/Allergies: Negative for environmental allergies. Does not bruise/bleed easily.  Psychiatric/Behavioral: Negative for  depression. The patient is not nervous/anxious and does not have insomnia.   All other systems reviewed and are negative.    Past Medical History:  She,  has a past medical history of Arthritis, Breast density, Chronic kidney disease, and Hypertension.   Surgical History:   Past Surgical History:  Procedure Laterality Date  . BREAST REDUCTION SURGERY  09/16/2011   Procedure: MAMMARY REDUCTION  (BREAST);  Surgeon: Charlene Brooke, MD;  Location: [place];  Service: Plastics;  Laterality: Bilateral;  . NO PAST SURGERIES       Social History:   reports that she has never smoked. She has never used smokeless tobacco. She reports that she does not drink alcohol and does not use drugs.   Family History:  Her family history includes Cancer in her paternal grandfather; Deep vein thrombosis in her paternal grandmother and sister; Diabetes in her maternal aunt, maternal uncle, maternal uncle, mother, paternal grandfather, and paternal grandmother; Emphysema in her father; Hypertension in her sister; Stroke in her sister.   Allergies Allergies  Allergen Reactions  . Lisinopril Cough     Home Medications  Prior to Admission medications   Medication Sig Start Date End Date Taking? Authorizing Provider  acetaminophen (TYLENOL) 500 MG tablet Take 500-1,000 mg by mouth every 6 (six) hours as needed for mild pain (or headaches).   Yes [provider]  ALPRAZolam (XANAX) 0.25 MG tablet Take 0.25 mg by mouth daily as needed  for anxiety.   Yes [provider]  amLODipine (NORVASC) 10 MG tablet Take 10 mg by mouth daily.   Yes [provider]  B Complex-C-Folic Acid (RENA-VITE RX) 1 MG TABS Take 1 tablet by mouth in the morning.   Yes [provider]  buPROPion (WELLBUTRIN XL) 300 MG 24 hr tablet Take 300 mg by mouth in the morning.   Yes [provider]  DULoxetine (CYMBALTA) 60 MG capsule Take 60 mg by mouth in the morning and at  bedtime.   Yes [provider]  Homeopathic Products ([place]) LIQD Apply 1 application topically See admin instructions. Apply to bilateral legs at bedtime   Yes [provider]  labetalol (NORMODYNE) 200 MG tablet Take 200 mg by mouth every 12 (twelve) hours.   Yes [provider]  losartan (COZAAR) 100 MG tablet Take 100 mg by mouth every evening.   Yes [provider]  sevelamer carbonate (RENVELA) 800 MG tablet Take 800-2,400 mg by mouth See admin instructions. Take 2,400 mg by mouth up to three times a day with meals and 800-1,600 mg with each snack   Yes [provider]  cholecalciferol (VITAMIN D) 1000 UNITS tablet Take 1,000 Units by mouth daily.    [provider]  losartan-hydrochlorothiazide (HYZAAR) 100-25 MG per tablet Take 1 tablet by mouth daily.    [provider]  magnesium gluconate (MAGONATE) 500 MG tablet Take 500 mg by mouth daily as needed (night time leg cramps).     [provider]  Multiple Vitamins-Minerals (MULTIVITAMIN WITH MINERALS) tablet Take 1 tablet by mouth daily.    [provider]  vitamin C (ASCORBIC ACID) 500 MG tablet Take 500 mg by mouth daily.    [provider]     Garner Nash, DO [facility] 10/27/2020 3:40 PM

## 2020-10-27 NOTE — ED Triage Notes (Signed)
C/o dry cough, SOB, and abd distention x 1 1/2 weeks.  States she was seen at [facility] last weekend for same and had a thoracentesis.  Denies pain. Last dialysis on Friday.

## 2020-10-27 NOTE — Consult Note (Addendum)
[facility] Renal Consultation Note    Indication for Consultation:  Management of ESRD/hemodialysis, anemia, hypertension/volume, and secondary hyperparathyroidism.  HPI: Meredith Reed is a 55 y.o. female with PMH including ESRD, hypertension, and arthritis, who presents to the ER today with shortness of breath.  Patient had a PD catheter placed on 09/06/2020 and was undergoing PD training but developed peritonitis.  She completed 2-week course of cefepime and vancomycin, but opted to return to hemodialysis with plans to have PD catheter removed outpatient.  She was then admitted to [facility] (discharged 10/22/2020) with shortness of breath.  She underwent dialysis at that time and also had a thoracentesis with removal of approximately 1 L clear fluid.  She did attend dialysis on 4/27 and 4/29.  EDW was being challenged at the dialysis unit and she had 2.1 L net UF yesterday, leaving at 83.4 kg.  CT of the abdomen and pelvis shows persistent bilateral effusions, enlarged since 10/21/2020, right greater than left.  Also incidental finding of 3 mm left lower lobe nodule.  Fat stranding in the right upper abdomen related to omental infarct, and stable appearance of peritoneal dialysis catheter.  Labs notable for K3.9, NA 135, creatinine 5.75, calcium 9.5, albumin 2.8, hemoglobin 9.3, white blood cell 6.4.  Vital signs notable for hypertension.  Pt seen in the ED. Reports difficulty taking deep breaths. + orthopnea. Also reports a non-productive cough for several weeks. No fever or chills. Denies central chest pain and dizziness. Denies any abdominal pain, redness or drainage from PD catheter. Does report some nausea but no vomiting or diarrhea. No peripheral edema. Has [facility] for dialysis access and denies any issues with HD lately including dizziness and cramping.   Past Medical History:  Diagnosis Date  . Arthritis    knees  . Breast density   . Chronic kidney disease   .  Hypertension    Past Surgical History:  Procedure Laterality Date  . BREAST REDUCTION SURGERY  09/16/2011   Procedure: MAMMARY REDUCTION  (BREAST);  Surgeon: Charlene Brooke, MD;  Location: [place];  Service: Plastics;  Laterality: Bilateral;  . NO PAST SURGERIES     Family History  Problem Relation Age of Onset  . Emphysema Father   . Hypertension Sister        x2  . Deep vein thrombosis Sister   . Stroke Sister   . Deep vein thrombosis Paternal Grandmother   . Diabetes Paternal Grandmother   . Cancer Paternal Grandfather        ? type  . Diabetes Paternal Grandfather   . Diabetes Mother   . Diabetes Maternal Aunt   . Diabetes Maternal Uncle   . Diabetes Maternal Uncle    Social History:  reports that she has never smoked. She has never used smokeless tobacco. She reports that she does not drink alcohol and does not use drugs.  ROS: As per HPI otherwise negative.  Physical Exam: Vitals:   10/27/20 1215 10/27/20 1246 10/27/20 1300 10/27/20 1346  BP: (!) 185/112 (!) 163/100 (!) 172/99 (!) 173/112  Pulse: 92 90 86 90  Resp: (!) '29 20 20   '$ Temp:      TempSrc:      SpO2: 96% 95% 93% 96%     General: Well developed, well nourished, in no acute distress. Head: Normocephalic, atraumatic, sclera non-icteric, mucus membranes are moist. Neck: Supple without lymphadenopathy/masses. JVD not elevated. Lungs: Decreased breath sounds bilateral bases, R>L. No wheezes,  rales, or rhonchi. Breathing is unlabored. Heart: RRR with normal S1, S2. No murmurs, rubs, or gallops appreciated. Abdomen: Soft, non-tender, non-distended with normoactive bowel sounds. No rebound/guarding. No obvious abdominal masses. PD cath in R lower abdomen with no redness, drainage or TTP Musculoskeletal:  Strength and tone appear normal for age. Lower extremities: No edema or ischemic changes, no open wounds. Neuro: Alert and oriented X 3. Moves all extremities spontaneously. Psych:   Responds to questions appropriately with a normal affect. Dialysis Access: R IJ TDC with clean, dry bandage  Allergies  Allergen Reactions  . Lisinopril Cough   Prior to Admission medications   Medication Sig Start Date End Date Taking? Authorizing Provider  acetaminophen (TYLENOL) 500 MG tablet Take 500-1,000 mg by mouth every 6 (six) hours as needed for mild pain (or headaches).   Yes [provider]  ALPRAZolam (XANAX) 0.25 MG tablet Take 0.25 mg by mouth daily as needed for anxiety.   Yes [provider]  amLODipine (NORVASC) 10 MG tablet Take 10 mg by mouth daily.   Yes [provider]  B Complex-C-Folic Acid (RENA-VITE RX) 1 MG TABS Take 1 tablet by mouth in the morning.   Yes [provider]  buPROPion (WELLBUTRIN XL) 300 MG 24 hr tablet Take 300 mg by mouth in the morning.   Yes [provider]  DULoxetine (CYMBALTA) 60 MG capsule Take 60 mg by mouth in the morning and at bedtime.   Yes [provider]  Homeopathic Products ([place]) LIQD Apply 1 application topically See admin instructions. Apply to bilateral legs at bedtime   Yes [provider]  labetalol (NORMODYNE) 200 MG tablet Take 200 mg by mouth every 12 (twelve) hours.   Yes [provider]  losartan (COZAAR) 100 MG tablet Take 100 mg by mouth every evening.   Yes [provider]  sevelamer carbonate (RENVELA) 800 MG tablet Take 800-2,400 mg by mouth See admin instructions. Take 2,400 mg by mouth up to three times a day with meals and 800-1,600 mg with each snack   Yes [provider]  cholecalciferol (VITAMIN D) 1000 UNITS tablet Take 1,000 Units by mouth daily.    [provider]  losartan-hydrochlorothiazide (HYZAAR) 100-25 MG per tablet Take 1 tablet by mouth daily.    [provider]  magnesium gluconate (MAGONATE) 500 MG tablet Take 500 mg by mouth daily as needed (night time leg cramps).     [provider]  Multiple Vitamins-Minerals (MULTIVITAMIN WITH MINERALS) tablet Take 1 tablet by mouth daily.    [provider]  vitamin C (ASCORBIC ACID) 500 MG tablet Take 500 mg by mouth daily.    [provider]   No current facility-administered medications for this encounter.   Current Outpatient Medications  Medication Sig Dispense Refill  . acetaminophen (TYLENOL) 500 MG tablet Take 500-1,000 mg by mouth every 6 (six) hours as needed for mild pain (or headaches).    . ALPRAZolam (XANAX) 0.25 MG tablet Take 0.25 mg by mouth daily as needed for anxiety.    Marland Kitchen amLODipine (NORVASC) 10 MG tablet Take 10 mg by mouth daily.    . B Complex-C-Folic Acid (RENA-VITE RX) 1 MG TABS Take 1 tablet by mouth in the morning.    Marland Kitchen buPROPion (WELLBUTRIN XL) 300 MG 24 hr tablet Take 300 mg by mouth in the morning.    . DULoxetine (CYMBALTA) 60 MG capsule Take 60 mg by mouth in the morning and at  bedtime.    . Homeopathic Products ([place]) LIQD Apply 1 application topically See admin instructions. Apply to bilateral legs at bedtime    . labetalol (NORMODYNE) 200 MG tablet Take 200 mg by mouth every 12 (twelve) hours.    Marland Kitchen losartan (COZAAR) 100 MG tablet Take 100 mg by mouth every evening.    . sevelamer carbonate (RENVELA) 800 MG tablet Take 800-2,400 mg by mouth See admin instructions. Take 2,400 mg by mouth up to three times a day with meals and 800-1,600 mg with each snack    . cholecalciferol (VITAMIN D) 1000 UNITS tablet Take 1,000 Units by mouth daily.    Marland Kitchen losartan-hydrochlorothiazide (HYZAAR) 100-25 MG per tablet Take 1 tablet by mouth daily.    . magnesium gluconate (MAGONATE) 500 MG tablet Take 500 mg by mouth daily as needed (night time leg cramps).     . Multiple Vitamins-Minerals (MULTIVITAMIN WITH MINERALS) tablet Take 1 tablet by mouth daily.    . vitamin C (ASCORBIC ACID) 500 MG tablet Take 500 mg by mouth daily.     Labs: Basic Metabolic Panel: Recent Labs   Lab 10/27/20 1123  NA 135  K 3.9  CL 94*  CO2 29  GLUCOSE 91  BUN 18  CREATININE 5.75*  CALCIUM 9.5   Liver Function Tests: Recent Labs  Lab 10/27/20 1123  AST 21  ALT 24  ALKPHOS 73  BILITOT 1.1  PROT 6.0*  ALBUMIN 2.8*   No results for input(s): LIPASE, AMYLASE in the last 168 hours. No results for input(s): AMMONIA in the last 168 hours. CBC: Recent Labs  Lab 10/27/20 1123  WBC 6.4  NEUTROABS 4.1  HGB 9.3*  HCT 29.1*  MCV 91.8  PLT 177   Cardiac Enzymes: No results for input(s): CKTOTAL, CKMB, CKMBINDEX, TROPONINI in the last 168 hours. CBG: No results for input(s): GLUCAP in the last 168 hours. Iron Studies: No results for input(s): IRON, TIBC, TRANSFERRIN, FERRITIN in the last 72 hours. Studies/Results: CT ABDOMEN PELVIS WO CONTRAST  Result Date: 10/27/2020 CLINICAL DATA:  55 year old with abdominal distension. EXAM: CT ABDOMEN AND PELVIS WITHOUT CONTRAST TECHNIQUE: Multidetector CT imaging of the abdomen and pelvis was performed following the standard protocol without IV contrast. COMPARISON:  10/21/2020 FINDINGS: Lower chest: Again noted are bilateral pleural effusions, right side greater than left. These pleural effusions are moderate in size. 3 mm nodule in the left lower lobe on sequence 4, image 10. Hepatobiliary: Stable hypodensity in the central left hepatic lobe measures roughly 1.2 cm and probably represents a cyst. Gallbladder is decompressed. Pancreas: Unremarkable. No pancreatic ductal dilatation or surrounding inflammatory changes. Spleen: Normal in size without focal abnormality. Adrenals/Urinary Tract: Normal appearance of the adrenal glands. Both kidneys are atrophic without hydronephrosis. Small amount of fluid in the urinary bladder. Stomach/Bowel: Normal appearance of the stomach. No evidence for focal bowel inflammation or obstruction. Vascular/Lymphatic: Normal caliber of the abdominal aorta without atherosclerotic calcifications. Small  retroperitoneal lymph nodes but no significant abdominal or pelvic lymph node enlargement. Reproductive: Uterus and bilateral adnexa are unremarkable. Other: Again noted is a peritoneal dialysis catheter which is coiled in the pelvis. Again noted is stranding in the right upper abdomen thought to be related to area of omental infarction. This area has decreased in size since 09/09/2020, measuring 8.0 cm and previously measuring 14.2 cm. Trace ascites in the pelvis and perihepatic region is likely related to the peritoneal dialysis catheter. Musculoskeletal: No acute bone abnormality. IMPRESSION: 1. Persistent bilateral pleural effusions  that may have slightly enlarged since 10/21/2020. These pleural effusions are moderate in size, right side greater than left. 2. 3 mm nodule in the left lower lobe is indeterminate. No follow-up needed if patient is low-risk. Non-contrast chest CT can be considered in 12 months if patient is high-risk. This recommendation follows the consensus statement: Guidelines for Management of Incidental Pulmonary Nodules Detected on CT Images: From the Fleischner Society 2017; Radiology 2017; 284:228-243. 3. No acute abnormality in the abdomen or pelvis. 4. Again noted is fat stranding in the right upper abdomen possibly related an evolving or resolving omental infarct. 5. Stable appearance of the peritoneal dialysis catheter. Small amount of free fluid is the likely associated with the peritoneal dialysis catheter. Electronically Signed   By: Markus Daft M.D.   On: 10/27/2020 13:12   DG Chest 1 View  Result Date: 10/27/2020 CLINICAL DATA:  Shortness of breath. EXAM: CHEST  1 VIEW COMPARISON:  10/22/2020 FINDINGS: Stable position of the right jugular dialysis catheter. The catheter tip is in the upper SVC region. There are new or increased perihilar densities bilaterally. Left hemidiaphragm is obscured and suggestive for consolidation and/or pleural fluid. Negative for pneumothorax. Heart  size is stable. IMPRESSION: 1. Increased perihilar densities bilaterally particularly in the left suprahilar region. Findings could represent central vascular congestion or developing infection. Recommend follow-up imaging. 2. Left basilar chest densities. Findings may represent a combination of consolidation and left pleural fluid. 3. Stable position of the dialysis catheter. Electronically Signed   By: Markus Daft M.D.   On: 10/27/2020 12:14    Outpatient Dialysis Orders:  Center: [facility]  on MWF. 180NRe, 4 hours, BFR 400, DFR 500, EDW 2K/2C, TDC, heparin 2000 unit bolus, Hectorol 1 mcg IV q HD No ESA  Renvela 800 mg, 2 tabs PO TID with meals  BP meds: amlodipine '10mg'$  daily, losartan '100mg'$  daily  Assessment/Plan: 1.  Shortness of breath: likely related to recurrent pleural effusions. CXR also shows increased perihilar densities suggestive of central vascular congestion. Suspect patient is losing weight and needs EDW lowered further to prevent re-accumulation of pleural effusons. Pulmonology also consulted. 2.  ESRD:  Dialyzes on MWF schedule. Will do extra HD today for volume removal. Will need PD cath removed outpatient.  3.  Hypertension: BP elevated, Resume home meds. Further volume reduction with HD as above.  4.  Anemia: Hgb 9.3. No on ESA outpatient, will start with HD Monday if Hgb remains [id number]. 5.  Metabolic bone disease: Corrected calcium is high, will hold VDRA for now. Continue binders and monitor phosphorus level.  6.  Nutrition:  Will benefit from renal diet/1.2L fluid restrictions and protein supplements once tolerating PO  Anice Paganini, PA-C 10/27/2020, 2:22 PM  [facility] Pager: [phone]  Nephrology attending: Patient was seen and examined in ER.  Chart reviewed.  I agree with assessment and plan as outlined above.  ESRD on HD MWF schedule, developed PD related peritonitis.  Recent admission to [facility] for shortness of breath status post  thoracocentesis with removal of around 1 L fluid, discharged on 4/25, last dialysis yesterday presents now with shortness of breath.  Chest x-ray with bilateral pleural effusion therefore seen by pulmonologist.  No loculated pleural effusion.  Patient with decreased oral intake therefore she might be losing weight.  We will do extra dialysis today for ultrafiltration.  She has [facility] for the access. She is currently on room air, sitting comfortable.  Bilateral basal reduced breath sound.  No peripheral edema.  Right IJ TDC intact.  Her husband at bedside.  Katheran James, MD [facility].

## 2020-10-27 NOTE — H&P (Addendum)
Date: 10/27/2020               Patient Name:  Meredith Reed MRN: [id number]  DOB: 05-Oct-1965 Age / Sex: 55 y.o., female   PCP: Meredith Reed         Medical Service: Internal Medicine Teaching Service         Attending Physician: Dr. Lalla Brothers     First Contact: Dr. Bridgett Larsson Pager: [phone]  Second Contact: Dr. Gilford Rile Pager: [phone]       After Hours (After 5p/  First Contact Pager: [phone]  weekends / holidays): Second Contact Pager: [phone]   Chief Complaint: shortness of breath  History of Present Illness:   Meredith Reed is a 55 y.o. 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  This initially started 1.5 weeks ago and she was admitted at [facility] from 4/23- 4/25. Had thoracentesis on right side with removal of 1L. Labs from this showed scattered reactive mesothelial cells with background lymphocytes, no organisms seen, no growth on culture, LDH 82, protein 1.2. Serum LDG was 242, protein 5.9. This was thought to incomplete dialysis sessions.  Her symptoms resolved after dialysis and thoracentesis at [facility] but recurred 3 to 4 days ago.  Shortness of breath is worse when lying flat.  Since then has had her full dialysis sessions.  Also has dry cough for about 3 weeks.  She has had nausea, decreased appetite, some weight loss over the past 6 months.  No known history of cancer.  Last colonoscopy 5 years ago demonstrated some polyps but no cancer, last mammogram 2 years ago and normal.  Has never smoked cigarettes.  She urinates about 3 times a day, no dysuria or difficulty with this but does not produce very much volume.  She has been on hemodialysis since December through a tunneled subclavian catheter.  Notably, she had peritoneal dialysis catheter placed on 5/00/93 complicated by bacterial peritonitis, treated with 2 weeks of cefepime and vancomycin. After that  opted to return to HD. She is on the kidney transplant list at [facility]  ED Course: Nephrology and pulmonology consulted.  Planning for additional dialysis in today for volume management.  Current Outpatient Medications  Medication Instructions  . acetaminophen (TYLENOL) 500-1,000 mg, Oral, Every 6 hours PRN  . ALPRAZolam (XANAX) 0.25 mg, Oral, Daily PRN  . amLODipine (NORVASC) 10 mg, Daily  . B Complex-C-Folic Acid (RENA-VITE RX) 1 MG TABS 1 tablet, Oral, Every morning  . buPROPion (WELLBUTRIN XL) 300 mg, Oral, Every morning  . cholecalciferol (VITAMIN D) 1,000 Units, Daily  . DULoxetine (CYMBALTA) 60 mg, Oral, 2 times daily  . Homeopathic Products (Walls) LIQD 1 application, Apply externally, See admin instructions, Apply to bilateral legs at bedtime  . labetalol (NORMODYNE) 200 mg, Oral, Every 12 hours  . losartan (COZAAR) 100 mg, Oral, Every evening  . magnesium gluconate (MAGONATE) 500 mg, Daily PRN  . Multiple Vitamins-Minerals (MULTIVITAMIN WITH MINERALS) tablet 1 tablet, Daily  . sevelamer carbonate (RENVELA) 800-2,400 mg, Oral, See admin instructions, Take 2,400 mg by mouth up to three times a day with meals and 800-1,600 mg with each snack  . vitamin C (ASCORBIC ACID) 500 mg, Daily    Allergies as of 10/27/2020 - Review Complete 10/27/2020  Allergen Reaction Noted  . Lisinopril Cough 02/16/2020    Past Medical History:  Diagnosis  Date  . Arthritis    knees  . Breast density   . Chronic kidney disease   . Hypertension     Past Surgical History:  Procedure Laterality Date  . BREAST REDUCTION SURGERY  09/16/2011   Procedure: MAMMARY REDUCTION  (BREAST);  Surgeon: Charlene Brooke, MD;  Location: [facility];  Service: Plastics;  Laterality: Bilateral;  . NO PAST SURGERIES      Family History  Problem Relation Age of Onset  . Emphysema Father   . Hypertension Sister        x2  . Deep vein thrombosis Sister   . Stroke Sister   . Deep  vein thrombosis Paternal Grandmother   . Diabetes Paternal Grandmother   . Cancer Paternal Grandfather        ? type  . Diabetes Paternal Grandfather   . Diabetes Mother   . Diabetes Maternal Aunt   . Diabetes Maternal Uncle   . Diabetes Maternal Uncle     Social History   Tobacco Use  . Smoking status: Never Smoker  . Smokeless tobacco: Never Used  Vaping Use  . Vaping Use: Never used  Substance Use Topics  . Alcohol use: No  . Drug use: No   Review of Systems: Review of Systems  Constitutional: Negative for chills and fever.  HENT: Negative for congestion and sore throat.   Respiratory: Positive for cough and shortness of breath. Negative for sputum production.   Cardiovascular: Positive for orthopnea. Negative for leg swelling.  Gastrointestinal: Positive for nausea. Negative for abdominal pain, diarrhea and vomiting.  Genitourinary: Negative for dysuria and frequency.  All other systems reviewed and are negative.    Physical Exam: Blood pressure (!) 171/118, pulse 96, temperature 99 F (37.2 C), temperature source Oral, resp. rate 20, last menstrual period 07/28/2012, SpO2 94 %. Physical Exam Vitals and nursing note reviewed.  Constitutional:      General: She is not in acute distress.    Appearance: She is well-developed. She is not ill-appearing.  HENT:     Head: Normocephalic and atraumatic.  Eyes:     Extraocular Movements: Extraocular movements intact.  Neck:     Vascular: No JVD.  Cardiovascular:     Rate and Rhythm: Normal rate and regular rhythm.     Heart sounds: No murmur heard. No gallop.   Pulmonary:     Effort: Pulmonary effort is normal. No tachypnea, accessory muscle usage or respiratory distress.     Breath sounds: Rales (Mild rales in right lower lung field only) present. No decreased breath sounds, wheezing or rhonchi.  Abdominal:     Palpations: Abdomen is soft.  Musculoskeletal:     Cervical back: Normal range of motion and neck supple.      Right lower leg: No edema.     Left lower leg: No edema.  Skin:    General: Skin is warm and dry.     Capillary Refill: Capillary refill takes less than 2 seconds.  Neurological:     General: No focal deficit present.     Mental Status: She is alert and oriented to person, place, and time.  Psychiatric:        Mood and Affect: Mood normal.        Behavior: Behavior normal.      EKG: personally reviewed my interpretation is normal sinus rhythm, T wave inversions in leads I, aVL, V2 which are new from prior in February.  Also has Q waves  in lead III which is new.  CXR: personally reviewed my interpretation is interstitial opacities bilaterally, opacity in left perihilar region, silhouette sign obscures the left diaphragmatic border could represent infiltrate of effusion though the costophrenic angle is sharp  CT ABDOMEN PELVIS WO CONTRAST  Result Date: 10/27/2020 CLINICAL DATA:  55 year old with abdominal distension. EXAM: CT ABDOMEN AND PELVIS WITHOUT CONTRAST TECHNIQUE: Multidetector CT imaging of the abdomen and pelvis was performed following the standard protocol without IV contrast. COMPARISON:  10/21/2020 FINDINGS: Lower chest: Again noted are bilateral pleural effusions, right side greater than left. These pleural effusions are moderate in size. 3 mm nodule in the left lower lobe on sequence 4, image 10. Hepatobiliary: Stable hypodensity in the central left hepatic lobe measures roughly 1.2 cm and probably represents a cyst. Gallbladder is decompressed. Pancreas: Unremarkable. No pancreatic ductal dilatation or surrounding inflammatory changes. Spleen: Normal in size without focal abnormality. Adrenals/Urinary Tract: Normal appearance of the adrenal glands. Both kidneys are atrophic without hydronephrosis. Small amount of fluid in the urinary bladder. Stomach/Bowel: Normal appearance of the stomach. No evidence for focal bowel inflammation or obstruction. Vascular/Lymphatic: Normal  caliber of the abdominal aorta without atherosclerotic calcifications. Small retroperitoneal lymph nodes but no significant abdominal or pelvic lymph node enlargement. Reproductive: Uterus and bilateral adnexa are unremarkable. Other: Again noted is a peritoneal dialysis catheter which is coiled in the pelvis. Again noted is stranding in the right upper abdomen thought to be related to area of omental infarction. This area has decreased in size since 09/09/2020, measuring 8.0 cm and previously measuring 14.2 cm. Trace ascites in the pelvis and perihepatic region is likely related to the peritoneal dialysis catheter. Musculoskeletal: No acute bone abnormality. IMPRESSION: 1. Persistent bilateral pleural effusions that may have slightly enlarged since 10/21/2020. These pleural effusions are moderate in size, right side greater than left. 2. 3 mm nodule in the left lower lobe is indeterminate. No follow-up needed if patient is low-risk. Non-contrast chest CT can be considered in 12 months if patient is high-risk. This recommendation follows the consensus statement: Guidelines for Management of Incidental Pulmonary Nodules Detected on CT Images: From the Fleischner Society 2017; Radiology 2017; 284:228-243. 3. No acute abnormality in the abdomen or pelvis. 4. Again noted is fat stranding in the right upper abdomen possibly related an evolving or resolving omental infarct. 5. Stable appearance of the peritoneal dialysis catheter. Small amount of free fluid is the likely associated with the peritoneal dialysis catheter. Electronically Signed   By: Markus Daft M.D.   On: 10/27/2020 13:12     Assessment & Plan by Problem: Active Problems:   Shortness of breath   Meredith Reed is a 55 y.o. female with hx of ESRD on HD, HTN presenting for shortness of breath, found to have recurrent bilateral pleural effusion, right greater than left.  She was seen for the same at [facility] from 4/23-4/25, felt better after  thoracentesis and additional hemodialysis treatment.  # Recurrent bilateral pleural effusion, transudative # Lung nodule Patient with recurrent shortness of breath.  She was seen for the same complaint at [facility] regional from 4/23-4/25 and found to have bilateral pleural effusions, right greater than left.  She received dialysis there and thoracentesis on the right side with removal of 1 L of straw-colored fluid at admission at [facility].  This appeared transudative in nature.  No growth on culture, no organisms seen on stain.  On CT scan this admission she has persistent bilateral  pleural effusions that have slightly enlarged compared to pre--drainage scan from 4/24, right greater than left.   She also has a 3 mm nodule without high risk features in the left lower lobe which is indeterminate.  In this patient with no smoking or other high risk history, no follow-up is needed per Fleischner Society guidelines. Albumin 2.8 on admission.  Normal AST, ALT, bilirubin, alk phos.  Cirrhosis is lower in differential, will obtain PT PTT.  She has had history of nausea and poor appetite over the past 6 months.  Last weight in our system was from 2015 at which time she weighed 200 pounds, she has 196 today so fairly stable.  She seems to be up-to-date on cancer screenings, colonoscopy scheduled in June.  Polyps seen on last 5 years ago.  She reports mammogram last 2 years ago which was normal, last on our system was 2015 and BI-RADS Category 1.  - Pulmonology following, patient recommendations - Holding off on thoracentesis for now, pending clinical changes after dialysis for volume management - Will obtain echo, no known heart disease but see below for EKG changes - Trend CMP, follow-up PT/INR and PTT  # ESRD on HD Monday Wednesday Friday # Normocytic anemia ESRD apparently due to IgA glomerular nephropathy.  Currently receiving dialysis through a tunneled right subclavian catheter, no plans for  fistula creation at this time.  She is on the transplant list at [facility]. Notably she has a peritoneal dialysis catheter in place which was inserted on 09/06/2020.  This was complicated by bacterial peritonitis now status post antibiotics.  Patient has opted to continue hemodialysis.  Has appointment to assess for removal of PD catheter this coming week. Nephrology following.  They suspect patient losing weight and adjustments to her dialysis parameters to prevent reaccumulation of pleural effusions. Hemoglobin of 9.3 and MCV of 91, likely due to chronic renal disease.  - Planning additional HD today for volume management - Sevelamer and others per nephrology  # New T wave inversions and Q wave on EKG Patient denies any chest pain, no known heart disease.  New heart failure could explain her pleural effusions as described above, though she does not appear fluid overloaded elsewhere.  - Follow-up troponin - Follow-up echo  # HTN Home medications of amlodipine 10 mg, losartan 100 mg, labetolol 232m BID. Blood pressure elevated to 170s over 110s this admission.  States her home pressure has been similar. - Resume home medications  Dispo: Admit patient to Inpatient with expected length of stay greater than 2 midnights.  Signed: CAndrew Au MD 10/27/2020, 4:26 PM  Pager: [phone] After 5pm on weekdays and 1pm on weekends: On Call pager: [phone]

## 2020-10-27 NOTE — Procedures (Addendum)
[place] Chest Procedure Note  Meredith Reed  [id number]  01-May-1966  Date:10/27/20  Time:3:54 PM   Provider Performing:Athol Bolds L Stephano Arrants   Procedure: [place] Chest Procedure   Indication(s) Bilateral Pleural Effusion  Consent Verbal consent was obtained from patient  Anesthesia None    Time Out Verified patient identification, verified procedure, site/side was marked, verified correct patient position, special equipment/implants available, medications/allergies/relevant history reviewed, required imaging and test results available.   Sterile Technique N/A  Procedure Description Ultrasound was used for visualization of the right chest.  There was free-flowing fluid within the right chest cavity and no evidence of loculations.  Complications/Tolerance None; patient tolerated the procedure well.  EBL 0cc   Specimen(s) None      Garner Nash, DO [facility] 10/27/2020 3:55 PM

## 2020-10-27 NOTE — ED Provider Notes (Signed)
[place] EMERGENCY DEPARTMENT Provider Note   CSN: [id number] Arrival date & time: 10/27/20  1005     History Chief Complaint  Patient presents with  . Shortness of Breath    Meredith Reed is a 55 y.o. female with a past medical history of hypertension, end-stage renal disease on hemodialysis Monday Wednesday Friday with last dialysis yesterday.  She currently has a peritoneal dialysis catheter in place however opted not to use it.  Patient was admitted to [facility] last week due to hypertensive urgency and abdominal distention.  Review of EMR shows that she underwent a thoracentesis with removal of 1050 mL of clear fluid however patient states that this was remove her from her abdomen.  She presents to this ER with complaint of shortness of breath due to abdominal distention.  She feels like this is the same as during her admission last week.  The patient has notable recent history of placement of dialysis catheter in the peritoneum on 09/06/2020 with development of bacterial peritonitis.  She completed a 2-week course of cefepime and vancomycin.  She denies chest pain, fever, chills, nausea, vomiting or diarrhea.  HPI     Past Medical History:  Diagnosis Date  . Arthritis    knees  . Breast density   . Chronic kidney disease   . Hypertension     Patient Active Problem List   Diagnosis Date Noted  . ESRD (end stage renal disease) ([place]) 05/18/2020  . Fibrocystic disease of breast 05/02/2020  . CKD (chronic kidney disease) stage 5, GFR less than 15 ml/min ([facility]) 10/11/2019  . History of anemia due to CKD 10/11/2019  . Pre-transplant evaluation for kidney transplant 07/31/2019  . Premenstrual tension syndrome 07/29/2019  . Arthritis   . Hypertension   . Breast density   . Macromastia 09/16/2011    Past Surgical History:  Procedure Laterality Date  . BREAST REDUCTION SURGERY  09/16/2011   Procedure: MAMMARY REDUCTION  (BREAST);  Surgeon: Charlene Brooke, MD;  Location: [facility];  Service: Plastics;  Laterality: Bilateral;  . NO PAST SURGERIES       OB History    Gravida  2   Para  2   Term      Preterm      AB      Living  2     SAB      IAB      Ectopic      Multiple      Live Births              Family History  Problem Relation Age of Onset  . Emphysema Father   . Hypertension Sister        x2  . Deep vein thrombosis Sister   . Stroke Sister   . Deep vein thrombosis Paternal Grandmother   . Diabetes Paternal Grandmother   . Cancer Paternal Grandfather        ? type  . Diabetes Paternal Grandfather   . Diabetes Mother   . Diabetes Maternal Aunt   . Diabetes Maternal Uncle   . Diabetes Maternal Uncle     Social History   Tobacco Use  . Smoking status: Never Smoker  . Smokeless tobacco: Never Used  Vaping Use  . Vaping Use: Never used  Substance Use Topics  . Alcohol use: No  . Drug use: No    Home Medications Prior to Admission medications   Medication  Sig Start Date End Date Taking? Authorizing Provider  amLODipine (NORVASC) 10 MG tablet Take 10 mg by mouth daily.    [provider]  cholecalciferol (VITAMIN D) 1000 UNITS tablet Take 1,000 Units by mouth daily.    [provider]  DULoxetine (CYMBALTA) 60 MG capsule Take 120 mg by mouth every evening.    [provider]  losartan-hydrochlorothiazide (HYZAAR) 100-25 MG per tablet Take 1 tablet by mouth daily.    [provider]  magnesium gluconate (MAGONATE) 500 MG tablet Take 500 mg by mouth daily as needed (night time leg cramps).     [provider]  Multiple Vitamins-Minerals (MULTIVITAMIN WITH MINERALS) tablet Take 1 tablet by mouth daily.    [provider]  vitamin C (ASCORBIC ACID) 500 MG tablet Take 500 mg by mouth daily.    [provider]    Allergies    Patient has no known allergies.  Review of Systems   Review of  Systems  Physical Exam Updated Vital Signs BP (!) 160/104 (BP Location: Left Arm)   Pulse 88   Temp 99.1 F (37.3 C) (Oral)   Resp (!) 24   LMP 07/28/2012   SpO2 99%   Physical Exam  ED Results / Procedures / Treatments   Labs (all labs ordered are listed, but only abnormal results are displayed) Labs Reviewed - No data to display  EKG EKG Interpretation  Date/Time:  Saturday October 27 2020 10:14:22 EDT Ventricular Rate:  88 PR Interval:  168 QRS Duration: 78 QT Interval:  386 QTC Calculation: 467 R Axis:   55 Text Interpretation: Normal sinus rhythm Anterior infarct , age undetermined Abnormal ECG new lateral t wave inversions Confirmed by Aletta Edouard [phone]) on 10/27/2020 10:20:09 AM   Radiology No results found.  Procedures Procedures   Medications Ordered in ED Medications - No data to display  ED Course  I have reviewed the triage vital signs and the nursing notes.  Pertinent labs & imaging results that were available during my care of the patient were reviewed by me and considered in my medical decision making (see chart for details).    MDM Rules/Calculators/A&P                          55 year old female with a history of end-stage renal disease who presents emergency department with shortness of breath.The emergent differential diagnosis for shortness of breath includes, but is not limited to, Pulmonary edema, bronchoconstriction, Pneumonia, Pulmonary embolism, Pneumotherax/ Hemothorax, Dysrythmia, ACS.  Patient has a complicated history including IgA nephropathy, recent admission at previous hospital with bilateral effusions not resolved after dialysis requiring thoracentesis but thought to be secondary to incomplete hemodialysis.  Patient is obviously hypertensive here.  No known history of CHF, malignancy.  She does have a history of recent bacterial peritonitis secondary to peritoneal catheter but this was a couple months ago.  I ordered and reviewed  labs that shows no elevated white blood cell count, CMP shows a creatinine of 5.75 he is consistent with history of end-stage renal disease, potassium 3.9, patient's respiratory panel is negative for influenza or COVID-19.  Case discussed with Dr.Bhandari of nephrology who feels that his effusions could be secondary to a different source and certainly seems that further investigation is warranted.  I also discussed the case with Shirlee Limerick, NP and Dr. Valeta Harms of pulmonology who will consult on the patient.  Patient will be admitted by internal medicine teaching  service, Dr. Evette Doffing.  Patient does not have any respiratory distress or hypoxia at this time.  I ordered and reviewed a 1 view chest x-ray which shows perihilar densities vascular congestion, CT abdomen pelvis shows moderate but larger than last week bilateral purulent pleural effusions which have reaccumulated. Final Clinical Impression(s) / ED Diagnoses Final diagnoses:  None    Rx / DC Orders ED Discharge Orders    None       Margarita Mail, PA-C 10/27/20 1628    Malvin Johns, MD 10/28/20 [phone]

## 2020-10-27 NOTE — Progress Notes (Signed)
  Echocardiogram 2D Echocardiogram has been performed.  Meredith Reed F 10/27/2020, 7:29 PM

## 2020-10-28 DIAGNOSIS — I5021 Acute systolic (congestive) heart failure: Secondary | ICD-10-CM | POA: Diagnosis present

## 2020-10-28 LAB — CBC
HCT: 31.3 % — ABNORMAL LOW (ref 36.0–46.0)
Hemoglobin: 10 g/dL — ABNORMAL LOW (ref 12.0–15.0)
MCH: 29.4 pg (ref 26.0–34.0)
MCHC: 31.9 g/dL (ref 30.0–36.0)
MCV: 92.1 fL (ref 80.0–100.0)
Platelets: 200 10*3/uL (ref 150–400)
RBC: 3.4 MIL/uL — ABNORMAL LOW (ref 3.87–5.11)
RDW: 14.8 % (ref 11.5–15.5)
WBC: 7.2 10*3/uL (ref 4.0–10.5)
nRBC: 0 % (ref 0.0–0.2)

## 2020-10-28 LAB — COMPREHENSIVE METABOLIC PANEL
ALT: 23 U/L (ref 0–44)
AST: 21 U/L (ref 15–41)
Albumin: 2.8 g/dL — ABNORMAL LOW (ref 3.5–5.0)
Alkaline Phosphatase: 79 U/L (ref 38–126)
Anion gap: 9 (ref 5–15)
BUN: 7 mg/dL (ref 6–20)
CO2: 29 mmol/L (ref 22–32)
Calcium: 9.3 mg/dL (ref 8.9–10.3)
Chloride: 97 mmol/L — ABNORMAL LOW (ref 98–111)
Creatinine, Ser: 3.43 mg/dL — ABNORMAL HIGH (ref 0.44–1.00)
GFR, Estimated: 15 mL/min — ABNORMAL LOW (ref >60)
Glucose, Bld: 101 mg/dL — ABNORMAL HIGH (ref 70–99)
Potassium: 4.1 mmol/L (ref 3.5–5.1)
Sodium: 135 mmol/L (ref 135–145)
Total Bilirubin: 0.6 mg/dL (ref 0.3–1.2)
Total Protein: 6.5 g/dL (ref 6.5–8.1)

## 2020-10-28 LAB — ECHOCARDIOGRAM COMPLETE
AR max vel: 1.43 cm2
AV Area VTI: 1.41 cm2
AV Area mean vel: 1.37 cm2
AV Mean grad: 6 mmHg
AV Peak grad: 11 mmHg
Ao pk vel: 1.66 m/s
Area-P 1/2: 5.62 cm2
Calc EF: 37.4 %
Height: 66 in
MV M vel: 6.62 m/s
MV Peak grad: 175.3 mmHg
Radius: 0.4 cm
S' Lateral: 4.55 cm
Single Plane A2C EF: 40.4 %
Single Plane A4C EF: 24.8 %

## 2020-10-28 MED ORDER — CHLORHEXIDINE GLUCONATE CLOTH 2 % EX PADS
6.0000 | MEDICATED_PAD | Freq: Every day | CUTANEOUS | Status: DC
  Administered 2020-10-28: 6 via TOPICAL

## 2020-10-28 MED ORDER — HEPARIN SODIUM (PORCINE) 1000 UNIT/ML IJ SOLN
INTRAMUSCULAR | Status: AC
  Filled 2020-10-28: qty 4

## 2020-10-28 MED ORDER — HEPARIN SODIUM (PORCINE) 1000 UNIT/ML IJ SOLN
3200.0000 [IU] | Freq: Once | INTRAMUSCULAR | Status: AC
  Administered 2020-10-28: 3200 [IU]
  Filled 2020-10-28: qty 3.2

## 2020-10-28 MED ORDER — CHLORHEXIDINE GLUCONATE CLOTH 2 % EX PADS
6.0000 | MEDICATED_PAD | Freq: Every day | CUTANEOUS | Status: DC
  Administered 2020-10-29 – 2020-10-30 (×2): 6 via TOPICAL

## 2020-10-28 NOTE — Progress Notes (Signed)
Subjective: Pt is seen lying on the bed comfortably . Husband in the room. She denies any shortness of breath and states her breathing has improved since Hemodialysis. Denies any chest pain. Pt still c/o dry cough. States she still has peritoneal dialysis catheter which will be removed by surgeon. States nephro team would like to have her dry weight down. Denies any complaint.   Objective:  Vital signs in last 24 hours: Vitals:   10/27/20 2317 10/27/20 2347 10/28/20 0009 10/28/20 0411  BP: (!) 184/113 (!) 180/107 (!) 170/94 (!) 165/92  Pulse: 94 98 98 78  Resp: (!) 31 (!) 30 (!) 22 (!) 24  Temp: 99 F (37.2 C)  99.8 F (37.7 C) 99 F (37.2 C)  TempSrc: Oral  Oral Oral  SpO2: 98% 96% 97% 96%  Weight:      Height:        Physical Exam Constitutional: no acute distress Head: atraumatic, normocephalic ENT: external ears normal Eyes: EOMI Cardiovascular: regular rate and rhythm, normal heart sounds Pulmonary: effort normal, No tachypnea, accessory muscle usage or respiratory distress. lungs clear to ascultation bilaterally. No dullness on percussion appreciated bilaterally. No decreased breath sounds, wheezing or rhonchi.   Abdominal: flat, nontender, no rebound tenderness, bowel sounds normal Musculoskeletal: range of motion normal.  Skin: warm and dry Neurological: alert, no focal deficit Psychiatric: normal mood and affect  Assessment/Plan: Meredith Reed is a 55 y.o. female with hx of ESRD on HD, HTN presenting for shortness of breath, found to have recurrent bilateral pleural effusion, right greater than left.  She was seen for the same at [facility] from 4/23-4/25, felt better after thoracentesis and additional hemodialysis treatment.  Principal Problem:   Acute HFrEF (heart failure with reduced ejection fraction) (HCC) Active Problems:   ESRD (end stage renal disease) (HCC)   Pleural effusion  # Recurrent bilateral pleural effusion, transudative # Lung  nodule Patient with recurrent shortness of breath.  She was seen for the same complaint at [facility] from 4/23-4/25 and found to have bilateral pleural effusions, right greater than left. Pt had 1 L of straw colored fluid removed last week at [place] [facility] which was found to be transudate in nature.  CT showed persistent bilateral pleural effusions that have slightly enlarged compared to pre--drainage scan from 4/24, right greater than left.  She received HD session yesterday and has been feeling better since then.   - Pulmonology following, patient recommendations - Holding off on thoracentesis for now, pending clinical changes after dialysis for volume management - ECHO showed decreased LV ejection fraction of 20-25%. Left ventricle demonstrates global kinesis. Consistent with Grade I diastolic dysfunction  (impaired relaxation).  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  Currently receiving dialysis through a tunneled right subclavian catheter, no plans for fistula creation at this time.  She is on the transplant list at [facility]. She has peritoneal dialysis catheter in place.  -Nephrology following, Appreciate recommendations. -Pt had HD yesterday and Nephrology planning on short HD again today.  - Sevelamer and others per nephrology.  # Acute Heart Failure ? With Low LVEF of 20-25% New T wave inversions and Q wave on EKG  ECHO showed decreased LV ejection fraction of 20-25%. Left ventricle demonstrates global kinesis. Consistent with Grade I diastolic dysfunction  (impaired relaxation). LA size mild to moderately dilated. Troponin low at 25>26 -  Cardiology Consult tomorrow.  #HTN BP stable 140/84 mmHg.  Continue Home medications of amlodipine 10 mg, losartan 100 mg, labetolol '200mg'$  BID.   Diet: Renal diet , Fluid resitriction of 1200 ml.  IVF:  None VTE:   Heparin Prior to Admission Living Arrangement:  Home Anticipated Discharge Location:  Home Barriers to Discharge:  Not medically stable Dispo: Anticipated discharge in approximately 2-3 day(s).   Armando Reichert, MD 10/28/2020, 10:47 AM On Call pager [phone]

## 2020-10-28 NOTE — Progress Notes (Addendum)
[facility] Progress Note   Subjective:   Seen in room, had HD late yesterday evening with net UF 3.5L. Sleeping awakens easily, reports shortness of breath is improved. No chest pain. Husband still feels her breathing is not at baseline.   Objective Vitals:   10/27/20 2317 10/27/20 2347 10/28/20 0009 10/28/20 0411  BP: (!) 184/113 (!) 180/107 (!) 170/94 (!) 165/92  Pulse: 94 98 98 78  Resp: (!) 31 (!) 30 (!) 22 (!) 24  Temp: 99 F (37.2 C)  99.8 F (37.7 C) 99 F (37.2 C)  TempSrc: Oral  Oral Oral  SpO2: 98% 96% 97% 96%  Weight:      Height:       Physical Exam General: WDWN female, alert and in NAD Heart: RRR, no murmur Lungs: Decreased breath sounds bilateral bases, R>L. No wheezes, rales, or rhonchi. Abdomen: Soft, non-tender, non-distended with normoactive bowel sounds. No rebound/guarding. No obvious abdominal masses. PD cath in R lower abdomen with no redness, drainage or TTP Extremities: No edema b/l lower extremities Dialysis Access: R IJ TDC with clean, dry bandage.   Additional Objective Labs: Basic Metabolic Panel: Recent Labs  Lab 10/27/20 1123 10/27/20 1726 10/28/20 0038  NA 135 134* 135  K 3.9 3.7 4.1  CL 94* 94* 97*  CO2 '29 31 29  '$ GLUCOSE 91 128* 101*  BUN '18 19 7  '$ CREATININE 5.75* 6.21* 3.43*  CALCIUM 9.5 9.3 9.3   Liver Function Tests: Recent Labs  Lab 10/27/20 1123 10/27/20 1726 10/28/20 0038  AST '21 22 21  '$ ALT '24 23 23  '$ ALKPHOS 73 77 79  BILITOT 1.1 0.8 0.6  PROT 6.0* 6.0* 6.5  ALBUMIN 2.8* 2.7* 2.8*   CBC: Recent Labs  Lab 10/27/20 1123 10/28/20 0038  WBC 6.4 7.2  NEUTROABS 4.1  --   HGB 9.3* 10.0*  HCT 29.1* 31.3*  MCV 91.8 92.1  PLT 177 200    Studies/Results: CT ABDOMEN PELVIS WO CONTRAST  Result Date: 10/27/2020 CLINICAL DATA:  55 year old with abdominal distension. EXAM: CT ABDOMEN AND PELVIS WITHOUT CONTRAST TECHNIQUE: Multidetector CT imaging of the abdomen and pelvis was performed following the  standard protocol without IV contrast. COMPARISON:  10/21/2020 FINDINGS: Lower chest: Again noted are bilateral pleural effusions, right side greater than left. These pleural effusions are moderate in size. 3 mm nodule in the left lower lobe on sequence 4, image 10. Hepatobiliary: Stable hypodensity in the central left hepatic lobe measures roughly 1.2 cm and probably represents a cyst. Gallbladder is decompressed. Pancreas: Unremarkable. No pancreatic ductal dilatation or surrounding inflammatory changes. Spleen: Normal in size without focal abnormality. Adrenals/Urinary Tract: Normal appearance of the adrenal glands. Both kidneys are atrophic without hydronephrosis. Small amount of fluid in the urinary bladder. Stomach/Bowel: Normal appearance of the stomach. No evidence for focal bowel inflammation or obstruction. Vascular/Lymphatic: Normal caliber of the abdominal aorta without atherosclerotic calcifications. Small retroperitoneal lymph nodes but no significant abdominal or pelvic lymph node enlargement. Reproductive: Uterus and bilateral adnexa are unremarkable. Other: Again noted is a peritoneal dialysis catheter which is coiled in the pelvis. Again noted is stranding in the right upper abdomen thought to be related to area of omental infarction. This area has decreased in size since 09/09/2020, measuring 8.0 cm and previously measuring 14.2 cm. Trace ascites in the pelvis and perihepatic region is likely related to the peritoneal dialysis catheter. Musculoskeletal: No acute bone abnormality. IMPRESSION: 1. Persistent bilateral pleural effusions that may have slightly enlarged since 10/21/2020. These  pleural effusions are moderate in size, right side greater than left. 2. 3 mm nodule in the left lower lobe is indeterminate. No follow-up needed if patient is low-risk. Non-contrast chest CT can be considered in 12 months if patient is high-risk. This recommendation follows the consensus statement: Guidelines for  Management of Incidental Pulmonary Nodules Detected on CT Images: From the Fleischner Society 2017; Radiology 2017; 284:228-243. 3. No acute abnormality in the abdomen or pelvis. 4. Again noted is fat stranding in the right upper abdomen possibly related an evolving or resolving omental infarct. 5. Stable appearance of the peritoneal dialysis catheter. Small amount of free fluid is the likely associated with the peritoneal dialysis catheter. Electronically Signed   By: Markus Daft M.D.   On: 10/27/2020 13:12   DG Chest 1 View  Result Date: 10/27/2020 CLINICAL DATA:  Shortness of breath. EXAM: CHEST  1 VIEW COMPARISON:  10/22/2020 FINDINGS: Stable position of the right jugular dialysis catheter. The catheter tip is in the upper SVC region. There are new or increased perihilar densities bilaterally. Left hemidiaphragm is obscured and suggestive for consolidation and/or pleural fluid. Negative for pneumothorax. Heart size is stable. IMPRESSION: 1. Increased perihilar densities bilaterally particularly in the left suprahilar region. Findings could represent central vascular congestion or developing infection. Recommend follow-up imaging. 2. Left basilar chest densities. Findings may represent a combination of consolidation and left pleural fluid. 3. Stable position of the dialysis catheter. Electronically Signed   By: Markus Daft M.D.   On: 10/27/2020 12:14   Medications:  . amLODipine  10 mg Oral Daily  . Chlorhexidine Gluconate Cloth  6 each Topical Q0600  . heparin  5,000 Units Subcutaneous Q8H  . heparin sodium (porcine)      . labetalol  200 mg Oral Q12H  . losartan  100 mg Oral QPM    Outpatient Dialysis Orders:  Center: [facility]  on MWF. 180NRe, 4 hours, BFR 400, DFR 500, EDW 2K/2C, TDC, heparin 2000 unit bolus, Hectorol 1 mcg IV q HD No ESA  Renvela 800 mg, 2 tabs PO TID with meals  BP meds: amlodipine '10mg'$  daily, losartan '100mg'$  daily  Assessment/Plan:  1.  Shortness of breath: likely  related to recurrent pleural effusions. CXR also showed increased perihilar densities suggestive of central vascular congestion. Suspect patient is losing weight and needs EDW lowered further to prevent re-accumulation of pleural effusons. SOB improved after HD but still not at baseline. Pulmonology also consulted but not recommended thoracentesis at this time. Echocardiogram performed yesterday, interpretation pending.  2.  ESRD:  Dialyzes on MWF schedule. Had extra HD on Saturday for volume removal. Short HD again today, will continue to challenge EDW. Will need PD cath removed outpatient.  3.  Hypertension: BP elevated, Resume home meds. Further volume reduction with HD as above.  4.  Anemia: Hgb 10.0. No on ESA outpatient 5.  Metabolic bone disease: Corrected calcium is high, will hold VDRA for now. Continue binders and monitor phosphorus level.  6.  Nutrition:  Will benefit from renal diet/1.2L fluid restrictions and protein supplements once tolerating PO    Anice Paganini, PA-C 10/28/2020, 9:00 AM  [facility] Pager: [phone]  Nephrology attending: Patient was seen and examined.  Chart reviewed.  I agree with assessment and plan as outlined above.  Status post dialysis yesterday with 3.5 L UF, tolerated well.  Patient reports her breathing is much better but is still not at baseline.  Not requiring oxygen.  Plan for  short treatment today mainly for ultrafiltration.  Need to lower dry weight on discharge.  Seen by pulmonologist who do not think that patient needs thoracocentesis. Discussed with the patient and her husband.  Katheran James, MD [facility].

## 2020-10-28 NOTE — Progress Notes (Signed)
Patient back form HD

## 2020-10-29 DIAGNOSIS — I5021 Acute systolic (congestive) heart failure: Secondary | ICD-10-CM

## 2020-10-29 LAB — CBC
HCT: 27.9 % — ABNORMAL LOW (ref 36.0–46.0)
Hemoglobin: 9 g/dL — ABNORMAL LOW (ref 12.0–15.0)
MCH: 29.7 pg (ref 26.0–34.0)
MCHC: 32.3 g/dL (ref 30.0–36.0)
MCV: 92.1 fL (ref 80.0–100.0)
Platelets: 215 K/uL (ref 150–400)
RBC: 3.03 MIL/uL — ABNORMAL LOW (ref 3.87–5.11)
RDW: 14.7 % (ref 11.5–15.5)
WBC: 5.6 K/uL (ref 4.0–10.5)
nRBC: 0 % (ref 0.0–0.2)

## 2020-10-29 LAB — PROTIME-INR
INR: 1 (ref 0.8–1.2)
Prothrombin Time: 13.5 s (ref 11.4–15.2)

## 2020-10-29 LAB — RENAL FUNCTION PANEL
Albumin: 2.4 g/dL — ABNORMAL LOW (ref 3.5–5.0)
Anion gap: 9 (ref 5–15)
BUN: 18 mg/dL (ref 6–20)
CO2: 27 mmol/L (ref 22–32)
Calcium: 9.2 mg/dL (ref 8.9–10.3)
Chloride: 99 mmol/L (ref 98–111)
Creatinine, Ser: 5.13 mg/dL — ABNORMAL HIGH (ref 0.44–1.00)
GFR, Estimated: 9 mL/min — ABNORMAL LOW (ref >60)
Glucose, Bld: 96 mg/dL (ref 70–99)
Phosphorus: 3.9 mg/dL (ref 2.5–4.6)
Potassium: 3.8 mmol/L (ref 3.5–5.1)
Sodium: 135 mmol/L (ref 135–145)

## 2020-10-29 MED ORDER — HEPARIN SODIUM (PORCINE) 1000 UNIT/ML IJ SOLN
INTRAMUSCULAR | Status: AC
  Filled 2020-10-29: qty 4

## 2020-10-29 MED ORDER — RENA-VITE PO TABS
1.0000 | ORAL_TABLET | Freq: Every day | ORAL | Status: DC
  Administered 2020-10-29 – 2020-10-30 (×2): 1 via ORAL
  Filled 2020-10-29 (×2): qty 1

## 2020-10-29 MED ORDER — GUAIFENESIN-DM 100-10 MG/5ML PO SYRP
5.0000 mL | ORAL_SOLUTION | ORAL | Status: DC | PRN
  Administered 2020-10-29 – 2020-10-30 (×3): 5 mL via ORAL
  Filled 2020-10-29 (×5): qty 5

## 2020-10-29 MED ORDER — NEPRO/CARBSTEADY PO LIQD
237.0000 mL | Freq: Two times a day (BID) | ORAL | Status: DC
  Administered 2020-10-30 – 2020-10-31 (×2): 237 mL via ORAL

## 2020-10-29 NOTE — Progress Notes (Signed)
Patient off floor to dialysis

## 2020-10-29 NOTE — Progress Notes (Signed)
[facility] Progress Note   Subjective:    Had extra dialysis yesterday with net UF 2L  Seen in HD unit this am. Breathing much better.   Objective Vitals:   10/29/20 0844 10/29/20 0900 10/29/20 0930 10/29/20 1000  BP: (!) 144/85 (!) 152/86 (!) 145/83 (!) 156/86  Pulse: 77 66 64 63  Resp:      Temp:      TempSrc:      SpO2:      Weight:      Height:       Physical Exam General: WDWN female, alert and in NAD Heart: RRR, no murmur Lungs: Decreased breath sounds bilateral bases, R>L. No wheezes, rales, or rhonchi. Abdomen: Soft, non-tender, non-distended with normoactive bowel sounds. No rebound/guarding. No obvious abdominal masses. PD cath in R lower abdomen with no redness, drainage or TTP Extremities: No edema b/l lower extremities Dialysis Access: R IJ TDC with clean, dry bandage.   Additional Objective Labs: Basic Metabolic Panel: Recent Labs  Lab 10/27/20 1726 10/28/20 0038 10/29/20 0531  NA 134* 135 135  K 3.7 4.1 3.8  CL 94* 97* 99  CO2 '31 29 27  '$ GLUCOSE 128* 101* 96  BUN '19 7 18  '$ CREATININE 6.21* 3.43* 5.13*  CALCIUM 9.3 9.3 9.2  PHOS  --   --  3.9   Liver Function Tests: Recent Labs  Lab 10/27/20 1123 10/27/20 1726 10/28/20 0038 10/29/20 0531  AST '21 22 21  '$ --   ALT '24 23 23  '$ --   ALKPHOS 73 77 79  --   BILITOT 1.1 0.8 0.6  --   PROT 6.0* 6.0* 6.5  --   ALBUMIN 2.8* 2.7* 2.8* 2.4*   CBC: Recent Labs  Lab 10/27/20 1123 10/28/20 0038 10/29/20 0531  WBC 6.4 7.2 5.6  NEUTROABS 4.1  --   --   HGB 9.3* 10.0* 9.0*  HCT 29.1* 31.3* 27.9*  MCV 91.8 92.1 92.1  PLT 177 200 215    Studies/Results:  Medications:  . amLODipine  10 mg Oral Daily  . Chlorhexidine Gluconate Cloth  6 each Topical Q0600  . heparin  5,000 Units Subcutaneous Q8H  . labetalol  200 mg Oral Q12H  . losartan  100 mg Oral QPM    Outpatient Dialysis Orders:  Center: [facility]  on MWF. 180NRe, 4 hours, BFR 400, DFR 500, EDW 2K/2C, TDC, heparin 2000  unit bolus, Hectorol 1 mcg IV q HD No ESA  Renvela 800 mg, 2 tabs PO TID with meals  BP meds: amlodipine '10mg'$  daily, losartan '100mg'$  daily  Assessment/Plan:  1.  Shortness of breath: likely related to recurrent pleural effusions. CXR also showed increased perihilar densities suggestive of central vascular congestion. Suspect patient is losing weight and needs EDW lowered further to prevent re-accumulation of pleural effusons.  Pulmonology also consulted but not recommended thoracentesis at this time. Echocardiogram performed yesterday, interpretation pending. SOB improving with HD 2.  ESRD:  Dialyzes on MWF schedule. Had extra HD on 4/30, 5/1 for volume removal. Back on schedule today. Will need PD cath removed outpatient.  3.  Hypertension: BP elevated, Resume home meds. Further volume reduction with HD as above.  4.  Anemia: Hgb 9.0. Start ESA if remains in hospital.  5.  Metabolic bone disease: Corrected calcium is high, will hold VDRA for now. Continue binders and monitor phosphorus level.  6.  Nutrition:  Will benefit from renal diet/1.2L fluid restrictions and protein supplements once tolerating PO  Lynnda Child PA-C [facility] 10/29/2020,10:38 AM

## 2020-10-29 NOTE — Progress Notes (Signed)
Initial Nutrition Assessment  DOCUMENTATION CODES:   Not applicable  INTERVENTION:    Nepro Shake po BID, each supplement provides 425 kcal and 19 grams protein.  Renal MVI daily.  NUTRITION DIAGNOSIS:   Increased nutrient needs related to chronic illness (ESRD on HD) as evidenced by estimated needs.  GOAL:   Patient will meet greater than or equal to 90% of their needs  MONITOR:   PO intake,Supplement acceptance  REASON FOR ASSESSMENT:   Malnutrition Screening Tool    ASSESSMENT:   55 yo female admitted with SOB, bilateral pleural effusion. PMH includes IgA nephropathy, ESRD, previously on PD, now on HD, HTN, arthritis.   S/P HD 4/30, 5/1, 5/2.  Patient reports that she has been eating poorly since she had bacterial peritonitis and switched to HD. She has a "month's supply" of Nepro at home allowing her to drink 3 supplements per week. She is concerned that she has not been getting her phosphorus binder since admission. Current phos level WNL at 3.9.   Usual weights reviewed. Patient with 16% weight loss within the past 3 months. Unable to determine amount of dry weight lost.   Labs and medications reviewed.  Currently on a renal diet with 1200 ml fluid restriction. She ate none of breakfast today, but most of lunch.   Patient is anuric.   NUTRITION - FOCUSED PHYSICAL EXAM:  Flowsheet Row Most Recent Value  Orbital Region No depletion  Upper Arm Region No depletion  Thoracic and Lumbar Region No depletion  Buccal Region No depletion  Temple Region No depletion  Clavicle Bone Region Mild depletion  Clavicle and Acromion Bone Region No depletion  Scapular Bone Region No depletion  Dorsal Hand No depletion  Patellar Region No depletion  Anterior Thigh Region No depletion  Posterior Calf Region Mild depletion  Edema (RD Assessment) None  Hair Reviewed  Eyes Reviewed  Mouth Reviewed  Skin Reviewed  Nails Reviewed       Diet Order:   Diet Order             Diet renal with fluid restriction Fluid restriction: 1200 mL Fluid; Room service appropriate? Yes; Fluid consistency: Thin  Diet effective now                 EDUCATION NEEDS:   Education needs have been addressed  Skin:  Skin Assessment: Reviewed RN Assessment  Last BM:  unsure  Height:   Ht Readings from Last 1 Encounters:  10/27/20 '5\' 6"'$  (1.676 m)    Weight:   Wt Readings from Last 1 Encounters:  10/29/20 74.7 kg    Ideal Body Weight:  59.1 kg  BMI:  Body mass index is 26.58 kg/m.  Estimated Nutritional Needs:   Kcal:  2250-2500  Protein:  105-120 gm  Fluid:  UOP + 1 L    Lucas Mallow, RD, LDN, CNSC Please refer to Amion for contact information.

## 2020-10-29 NOTE — Progress Notes (Signed)
NAME:  Meredith Reed, MRN:  [id number], DOB:  09-21-1965, LOS: 2 ADMISSION DATE:  10/27/2020, CONSULTATION DATE: 10/27/2020 REFERRING MD: Dr. Tamera Reed, CHIEF COMPLAINT: Shortness of breath  History of Present Illness:   This is a 55 year old female, past medical history of chronic kidney disease, IgA nephropathy was on immunosuppression previously, initially started on peritoneal dialysis had bacterial peritonitis, still has the catheter in place no longer using peritoneal dialysis.  Now has a right tunneled catheter subclavian undergoing HD.  Was recently seen at [facility] on 10/22/2020.  Patient had complaints of shortness of breath.  Patient was found to have bilateral pleural effusion.  Patient underwent an radiology ultrasound-guided thoracentesis with 1050cc fluid results revealed white blood cell count 842 pleural fluid analysis with 21% monocytes, 3% eosinophils.  Patient was discharged home.  Patient presented 4/30 to the emergency department here at [facility] with shortness of breath.  She did not require any oxygen she does have shortness of breath and difficulty taking a full inspiratory effort.  This brought her to the hospital for further evaluation.  Chest x-ray today reveals bilateral small pleural effusion.  Also has fluid in the pleural space as seen on abdominal CT which completed in the ER as well.  Pertinent  Medical History   Past Medical History:  Diagnosis Date  . Arthritis    knees  . Breast density   . Chronic kidney disease   . Hypertension      Significant Hospital Events: Including procedures, antibiotic start and stop dates in addition to other pertinent events   . ED evaluation . Pulmonary critical care consultation 10/27/2020 . HD 4/30, 5/1, 5/2  Interim History / Subjective:  Patient sitting up in bed. Reports she feels much better. No SOB or CP. Able to take deep breaths but causes her to cough. No other complaints.  Objective    Blood pressure (!) 161/96, pulse 66, temperature 98.7 F (37.1 C), temperature source Oral, resp. rate 14, height '5\' 6"'$  (1.676 m), weight 78.1 kg, last menstrual period 07/28/2012, SpO2 93 %.        Intake/Output Summary (Last 24 hours) at 10/29/2020 1205 Last data filed at 10/29/2020 0800 Gross per 24 hour  Intake 200 ml  Output 2000 ml  Net -1800 ml   Filed Weights   10/28/20 1059 10/28/20 1319 10/29/20 0836  Weight: 82 kg 79.5 kg 78.1 kg    Examination: General: Well-appearing woman sitting up in bed. No acute distress. HENT: EOMI. Tracking appropriately. Lungs: No increased work of breathing. Symmetric chest wall expansion. Lungs clear to auscultation bilaterally, mildly reduced breath sounds in lower lobes bilaterally. On room air. Cardiovascular: Regular rate and rhythm. No murmurs, rubs, gallops. 2+ peripheral pulses. Abdomen: Soft, non-tender. Extremities: Warm, dry. Neuro: Alert and oriented.  Labs/imaging that I havepersonally reviewed  (right click and "Reselect all SmartList Selections" daily)  WBC 5.6 Na 135 K 3.8 Cr 5.13 Hgb 9.0  Resolved Hospital Problem list     Assessment & Plan:  Ms. Meredith Reed is a 55 y/o F with a past medical history of ESRD on hemodialysis (MWF) 2/2 to IgA nephropathy who presented to [facility] with shortness of breath and bilateral pleural effusions. She was admitted to [facility] on 4/25 for a similar presentation and thoracentesis revealed a likely transudative effusion. Echo performed this hospital admission (4/30) revealed newly reduced EF 20-25% (previous echo 08/2019 was normal). Recurrent bilateral pleural effusions likely due to new heart  failure diagnosis in addition to insufficient hemodialysis. Recent thoracentesis reassuring that etiology of pleural effusions is unlikely to be infectious or malignant.  #Recurrent bilateral pleural effusions  #Dyspnea - Patient is improving - Likely secondary to new onset heart failure +  ESRD and incomplete HD - No need for thoracentesis at this time. Recent thoracentesis and pleural fluid analysis suggests transudative effusion. U/S did not show any loculations and patient has no other signs of active infection.  - Per nephro note, EDW needs to lowered further to prevent reaccumulation of pleural effusions - Cards has been consulted.  #ESRD on transplant list  #IgA nephropathy - HD MWF, has received extra HD 4/30, 5/1, 5/2 which seems to be improving patient's symptoms - PD cath removed outpatient per nephrology - Nephrology continues to follow  #Anemia - Nephro monitoring. Likely secondary to CKD. Hgb today 9.0. - May start ESA in hospital  #HTN - BP elevated. Back on home meds. Managed by primary team.   Meredith Reed, MS4

## 2020-10-29 NOTE — Progress Notes (Addendum)
Subjective: No acute overnight events. Overall feeling better   Objective:  Vital signs in last 24 hours: Vitals:   10/28/20 1622 10/28/20 2153 10/28/20 2258 10/29/20 0457  BP: 135/89 (!) 151/98 (!) 151/91 (!) 143/91  Pulse: 74 90 92 72  Resp: '19  20 20  '$ Temp: 98.9 F (37.2 C)  98.6 F (37 C) 98 F (36.7 C)  TempSrc: Oral  Oral Oral  SpO2: 97%  98% 95%  Weight:      Height:         Physical Exam Pt examined during HD session. Cardiac: RRR, no rubs, murmurs or gallops Pulm: clear to auscultation bilaterally from anterior lung fields Abd: soft, nontender, nondistended, BS present Ext: warm and well perfused, 1+ pedal edema bilaterally   Assessment/Plan: Meredith Reed is a 55 y.o. female with hx of ESRD on HD, HTN presenting for shortness of breath, found to have recurrent bilateral pleural effusion, right greater than left.  She was seen for the same at [facility] regional from 4/23-4/25, felt better after thoracentesis and additional hemodialysis treatment. S/P 1 Full HD (Saturday)and 1 short HD (Sunday).  Principal Problem:   Acute HFrEF (heart failure with reduced ejection fraction) (HCC) Active Problems:   ESRD (end stage renal disease) (HCC)   Pleural effusion  # Recurrent bilateral pleural effusion, transudative # Lung nodule Patient with recurrent shortness of breath.  She was seen for the same complaint at [facility] from 4/23-4/25 and found to have bilateral pleural effusions, right greater than left. Pt had 1 L of straw colored fluid removed last week at [place] [facility] which was found to be transudate in nature.  CT showed persistent bilateral pleural effusions that have slightly enlarged compared to pre--drainage scan from 4/24, right greater than left.  She received HD session on Sat and short session on Sunday and has been feeling better since then.  - Pulmonology following, appreciate recommendations. - Holding off on thoracentesis for now,  pending clinical changes after dialysis for volume management - ECHO showed decreased LV ejection fraction of 20-25%. Left ventricle demonstrates global kinesis. Consistent with Grade I diastolic dysfunction  (impaired relaxation).  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  Currently receiving dialysis through a tunneled right subclavian catheter, no plans for fistula creation at this time.  She is on the transplant list at [facility]. She has peritoneal dialysis catheter in place.  -Nephrology following, Appreciate recommendations. -Pt had Full HD on Saturday and short HD yesterday. Getting Full HD today as per her usual schedule. - Sevelamer and others per nephrology.   # Acute Heart Failure ? With Low LVEF of 20-25% New T wave inversions and Q wave on EKG  ECHO showed decreased LV ejection fraction of 20-25%. Left ventricle demonstrates global kinesis. Consistent with Grade I diastolic dysfunction  (impaired relaxation). LA size mild to moderately dilated. Troponin low at 25>26 - Cardiology Consulted. They will see her today.   #HTN BP stable 143/91 mmHg.  Continue Home medications of amlodipine 10 mg, losartan 100 mg, labetolol '200mg'$  BID.   Diet: Renal diet , Fluid resitriction of 1200 ml.  IVF:  None VTE:  Heparin Prior to Admission Living Arrangement:  Home Anticipated Discharge Location:  Home Barriers to Discharge:  Not medically stable Dispo: Anticipated discharge in approximately 2-3 day(s).   Armando Reichert, MD 10/29/2020, 5:58 AM On Call  pager [phone]

## 2020-10-29 NOTE — Progress Notes (Signed)
Cardiology is considering ischemic workup tomorrow.  Pt will be seen by cardiology tomorrow morning. Asked to keep Pt NPO after midnight.   Dr. Armando Reichert MD PGY1 [facility] health Internal medicine

## 2020-10-29 NOTE — Discharge Summary (Addendum)
Name: Meredith Reed MRN: [id number] DOB: 10/26/1965 55 y.o. PCP: Meredith Dus, MD  Date of Admission: 10/27/2020 10:09 AM Date of Discharge:  10/31/2020 Attending Physician: Meredith Groves, DO   Discharge Diagnosis: Acute HFrEF (heart failure with reduced ejection fraction) ([place]) ESRD (end stage renal disease) ([place]) on HD Pleural effusion HTN Discharge Medications: Allergies as of 10/31/2020       Reactions   Lisinopril Cough        Medication List     TAKE these medications    acetaminophen 500 MG tablet Commonly known as: TYLENOL Take 500-1,000 mg by mouth every 6 (six) hours as needed for mild pain (or headaches).   ALPRAZolam 0.25 MG tablet Commonly known as: XANAX Take 0.25 mg by mouth daily as needed for anxiety.   amLODipine 10 MG tablet Commonly known as: NORVASC Take 10 mg by mouth daily.   buPROPion 300 MG 24 hr tablet Commonly known as: WELLBUTRIN XL Take 300 mg by mouth in the morning.   cholecalciferol 1000 units tablet Commonly known as: VITAMIN D Take 1,000 Units by mouth daily.   DULoxetine 60 MG capsule Commonly known as: CYMBALTA Take 60 mg by mouth in the morning and at bedtime.   labetalol 200 MG tablet Commonly known as: NORMODYNE Take 200 mg by mouth every 12 (twelve) hours.   losartan 100 MG tablet Commonly known as: COZAAR Take 100 mg by mouth every evening.   magnesium gluconate 500 MG tablet Commonly known as: MAGONATE Take 500 mg by mouth daily as needed (night time leg cramps).   multivitamin with minerals tablet Take 1 tablet by mouth daily.   Rena-Vite Rx 1 MG Tabs Take 1 tablet by mouth in the morning.   sevelamer carbonate 800 MG tablet Commonly known as: RENVELA Take 800-2,400 mg by mouth See admin instructions. Take 2,400 mg by mouth up to three times a day with meals and 800-1,600 mg with each snack   Theraworx Relief Liqd Apply 1 application topically See admin instructions. Apply to bilateral legs at  bedtime   vitamin C 500 MG tablet Commonly known as: ASCORBIC ACID Take 500 mg by mouth daily.        Disposition and follow-up:   [name] was discharged from [facility] in Stable condition.  At the hospital follow up visit please address:  1.  Follow up: - Follow Up with Cardiology Dr. Vernell Leep MD as soon as possible in 1 week.  - Follow Up with Nephrology for HD needs. - Follow Up with PCP Dr. Natividad Reed in 1 week.  2.  Labs / imaging needed at time of follow-up: None  3.  Pending labs/ test needing follow-up: None  Follow-up Appointments:  Follow-up Information     Meredith Reed. Call in 1 week(s).          Reed, Meredith Bowen, MD. Schedule an appointment as soon as possible for a visit in 1 week(s).   Specialties: Cardiology, Radiology Contact information: [place] [id number] [address] Hospital Course by problem list: Meredith Reed is a 55 y.o. female with hx of ESRD on HD, HTN presenting for shortness of breath, found to have recurrent bilateral pleural effusion, right greater than left.  She was seen for the same at [facility] regional from 4/23-4/25, felt better after thoracentesis and additional hemodialysis  treatment.   # Recurrent bilateral pleural effusion, transudative # Lung nodule Patient with recurrent shortness of breath.  She was seen for the same complaint at [facility] regional from 4/23-4/25 and found to have bilateral pleural effusions, right greater than left. Pt had 1 L of straw colored fluid removed last week at [place] [facility] which was found to be transudate in nature. CT showed persistent bilateral pleural effusions that have slightly enlarged compared to pre--drainage scan from 4/24, right greater than left.  She received extra HD session on Sat and short session on Sunday and felt better since then. Nephrology suspected Pt losing weight and   lowered EDW further to prevent  Fluid re-accumulation.   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  Currently receiving dialysis through a tunneled right subclavian catheter, no plans for fistula creation at this time.  She is on the transplant list at [facility]. She has peritoneal dialysis catheter in place. Pt felt better after extra HD session on 10/27/20 and short HD on sunday 10/28/2020. Pt got HD per her usual schedule. HB stable, 9.6 at discharge.     # Acute HFrEF (heart failure with reduced ejection fraction) (HCC)  and New Q wave on EKG   EKG showed tiny Q wave in lead 3 and aVF. ECHO showed decreased LV ejection fraction of 20-25%. Left ventricle demonstrates global kinesis. Consistent with Grade I diastolic dysfunction  (impaired relaxation). LA size mild to moderately dilated. Troponin low at 25>26. Cardiology Consulted. Nuclear stress test was done which didn't show any infarction or ischemia, stress test even better than ECHO.    # HTN Pt was restarted on her home medications of amlodipine 10 mg, losartan 100 mg, labetolol '200mg'$  BID.    Subjective on day of discharge: Examined at beside. Feeling well. Will have HD later today.  Denies any Chest pain, SOB. Denies any complaints or concerns. Updated on nuclear stress results and discussed needing to lower estimated dry weight for dialysis. She is comfortable with discharge home later today.   Discharge Exam:   BP 121/69   Pulse 72   Temp 97.8 F (36.6 C) (Oral)   Resp 15   Ht '5\' 6"'$  (1.676 m)   Wt 81.5 kg   LMP 07/28/2012   SpO2 99%   BMI 29.00 kg/m  Discharge exam: Physical Exam Vitals and nursing note reviewed.  Constitutional:      General: She is not in acute distress.    Appearance: She is not ill-appearing, toxic-appearing or diaphoretic.  HENT:     Head: Normocephalic and atraumatic.  Cardiovascular:     Rate and Rhythm: Normal rate and regular rhythm.  Pulmonary:      Effort: Pulmonary effort is normal. No tachypnea.     Breath sounds: Normal breath sounds.  Abdominal:     General: Bowel sounds are normal.     Palpations: Abdomen is soft.  Musculoskeletal:     Cervical back: Normal range of motion.  Skin:    General: Skin is warm and dry.  Neurological:     General: No focal deficit present.     Mental Status: She is alert and oriented to person, place, and time.     Pertinent Labs, Studies, and Procedures:  CT ABDOMEN PELVIS WO CONTRAST  Result Date: 10/27/2020 CLINICAL DATA:  55 year old with abdominal distension. EXAM: CT ABDOMEN AND PELVIS WITHOUT CONTRAST TECHNIQUE: Multidetector CT imaging of the abdomen and pelvis was  performed following the standard protocol without IV contrast. COMPARISON:  10/21/2020 FINDINGS: Lower chest: Again noted are bilateral pleural effusions, right side greater than left. These pleural effusions are moderate in size. 3 mm nodule in the left lower lobe on sequence 4, image 10. Hepatobiliary: Stable hypodensity in the central left hepatic lobe measures roughly 1.2 cm and probably represents a cyst. Gallbladder is decompressed. Pancreas: Unremarkable. No pancreatic ductal dilatation or surrounding inflammatory changes. Spleen: Normal in size without focal abnormality. Adrenals/Urinary Tract: Normal appearance of the adrenal glands. Both kidneys are atrophic without hydronephrosis. Small amount of fluid in the urinary bladder. Stomach/Bowel: Normal appearance of the stomach. No evidence for focal bowel inflammation or obstruction. Vascular/Lymphatic: Normal caliber of the abdominal aorta without atherosclerotic calcifications. Small retroperitoneal lymph nodes but no significant abdominal or pelvic lymph node enlargement. Reproductive: Uterus and bilateral adnexa are unremarkable. Other: Again noted is a peritoneal dialysis catheter which is coiled in the pelvis. Again noted is stranding in the right upper abdomen thought to be  related to area of omental infarction. This area has decreased in size since 09/09/2020, measuring 8.0 cm and previously measuring 14.2 cm. Trace ascites in the pelvis and perihepatic region is likely related to the peritoneal dialysis catheter. Musculoskeletal: No acute bone abnormality. IMPRESSION: 1. Persistent bilateral pleural effusions that may have slightly enlarged since 10/21/2020. These pleural effusions are moderate in size, right side greater than left. 2. 3 mm nodule in the left lower lobe is indeterminate. No follow-up needed if patient is low-risk. Non-contrast chest CT can be considered in 12 months if patient is high-risk. This recommendation follows the consensus statement: Guidelines for Management of Incidental Pulmonary Nodules Detected on CT Images: From the Fleischner Society 2017; Radiology 2017; 284:228-243. 3. No acute abnormality in the abdomen or pelvis. 4. Again noted is fat stranding in the right upper abdomen possibly related an evolving or resolving omental infarct. 5. Stable appearance of the peritoneal dialysis catheter. Small amount of free fluid is the likely associated with the peritoneal dialysis catheter. Electronically Signed   By: Markus Daft M.D.   On: 10/27/2020 13:12   DG Chest 1 View  Result Date: 10/27/2020 CLINICAL DATA:  Shortness of breath. EXAM: CHEST  1 VIEW COMPARISON:  10/22/2020 FINDINGS: Stable position of the right jugular dialysis catheter. The catheter tip is in the upper SVC region. There are new or increased perihilar densities bilaterally. Left hemidiaphragm is obscured and suggestive for consolidation and/or pleural fluid. Negative for pneumothorax. Heart size is stable. IMPRESSION: 1. Increased perihilar densities bilaterally particularly in the left suprahilar region. Findings could represent central vascular congestion or developing infection. Recommend follow-up imaging. 2. Left basilar chest densities. Findings may represent a combination of  consolidation and left pleural fluid. 3. Stable position of the dialysis catheter. Electronically Signed   By: Markus Daft M.D.   On: 10/27/2020 12:14   ECHOCARDIOGRAM COMPLETE  Result Date: 10/28/2020    ECHOCARDIOGRAM REPORT   Patient Name:   Meredith Reed Date of Exam: 10/27/2020 Medical Rec #:  [id number]       Height:       66.0 in Accession #:    [id number]      Weight:       196.8 lb Date of Birth:  1966-03-11       BSA:          1.986 m Patient Age:    89 years        BP:  176/123 mmHg Patient Gender: F               HR:           94 bpm. Exam Location:  Inpatient Procedure: 2D Echo, Cardiac Doppler and Color Doppler Indications:    Pleural effusions, bilateral  History:        Patient has no prior history of Echocardiogram examinations.                 Risk Factors:Hypertension. CKD stage 5.  Sonographer:    Merrie Roof RDCS Referring Phys: [place]  1. Left ventricular ejection fraction, by estimation, is 20 to 25%. The left ventricle has severely decreased function. The left ventricle demonstrates global hypokinesis. There is mild concentric left ventricular hypertrophy. Left ventricular diastolic  parameters are consistent with Grade I diastolic dysfunction (impaired relaxation).  2. Right ventricular systolic function is normal. The right ventricular size is normal. Tricuspid regurgitation signal is inadequate for assessing PA pressure.  3. Left atrial size was mild to moderately dilated.  4. A small pericardial effusion is present. The pericardial effusion is circumferential. There is no evidence of cardiac tamponade.  5. The mitral valve is normal in structure. Moderate mitral valve regurgitation.  6. The aortic valve is normal in structure. Aortic valve regurgitation is not visualized. No aortic stenosis is present.  7. The inferior vena cava is normal in size with <50% respiratory variability, suggesting right atrial pressure of 8 mmHg. Comparison(s): No prior  Echocardiogram. FINDINGS  Left Ventricle: Left ventricular ejection fraction, by estimation, is 20 to 25%. The left ventricle has severely decreased function. The left ventricle demonstrates global hypokinesis. The left ventricular internal cavity size was normal in size. There is mild concentric left ventricular hypertrophy. Left ventricular diastolic parameters are consistent with Grade I diastolic dysfunction (impaired relaxation). Normal left ventricular filling pressure. Right Ventricle: The right ventricular size is normal. No increase in right ventricular wall thickness. Right ventricular systolic function is normal. Tricuspid regurgitation signal is inadequate for assessing PA pressure. Left Atrium: Left atrial size was mild to moderately dilated. Right Atrium: Right atrial size was normal in size. Pericardium: A small pericardial effusion is present. The pericardial effusion is circumferential. There is no evidence of cardiac tamponade. Mitral Valve: The mitral valve is normal in structure. Moderate mitral valve regurgitation, with centrally-directed jet. Tricuspid Valve: The tricuspid valve is normal in structure. Tricuspid valve regurgitation is not demonstrated. Aortic Valve: The aortic valve is normal in structure. Aortic valve regurgitation is not visualized. No aortic stenosis is present. Aortic valve mean gradient measures 6.0 mmHg. Aortic valve peak gradient measures 11.0 mmHg. Aortic valve area, by VTI measures 1.41 cm. Pulmonic Valve: The pulmonic valve was not well visualized. Pulmonic valve regurgitation is not visualized. Aorta: The aortic root and ascending aorta are structurally normal, with no evidence of dilitation. Venous: The inferior vena cava is normal in size with less than 50% respiratory variability, suggesting right atrial pressure of 8 mmHg. IAS/Shunts: No atrial level shunt detected by color flow Doppler. Additional Comments: There is pleural effusion in the left lateral region.   LEFT VENTRICLE PLAX 2D LVIDd:         5.20 cm LVIDs:         4.55 cm LV PW:         1.20 cm LV IVS:        1.30 cm LVOT diam:     1.90 cm LV SV:  39 LV SV Index:   19 LVOT Area:     2.84 cm  LV Volumes (MOD) LV vol d, MOD A2C: 134.0 ml LV vol d, MOD A4C: 102.0 ml LV vol s, MOD A2C: 79.9 ml LV vol s, MOD A4C: 76.7 ml LV SV MOD A2C:     54.1 ml LV SV MOD A4C:     102.0 ml LV SV MOD BP:      46.5 ml RIGHT VENTRICLE          IVC RV Basal diam:  3.40 cm  IVC diam: 1.80 cm LEFT ATRIUM             Index       RIGHT ATRIUM           Index LA diam:        4.30 cm 2.16 cm/m  RA Area:     13.90 cm LA Vol (A2C):   85.5 ml 43.05 ml/m RA Volume:   31.30 ml  15.76 ml/m LA Vol (A4C):   43.5 ml 21.90 ml/m LA Biplane Vol: 65.4 ml 32.93 ml/m  AORTIC VALVE AV Area (Vmax):    1.43 cm AV Area (Vmean):   1.37 cm AV Area (VTI):     1.41 cm AV Vmax:           166.00 cm/s AV Vmean:          109.000 cm/s AV VTI:            0.273 m AV Peak Grad:      11.0 mmHg AV Mean Grad:      6.0 mmHg LVOT Vmax:         83.90 cm/s LVOT Vmean:        52.600 cm/s LVOT VTI:          0.136 m LVOT/AV VTI ratio: 0.50  AORTA Ao Root diam: 2.70 cm MITRAL VALVE MV Area (PHT): 5.62 cm      SHUNTS MV Decel Time: 135 msec      Systemic VTI:  0.14 m MR Peak grad:    175.3 mmHg  Systemic Diam: 1.90 cm MR Mean grad:    105.0 mmHg MR Vmax:         662.00 cm/s MR Vmean:        472.0 cm/s MR PISA:         1.01 cm MR PISA Eff ROA: 5 mm MR PISA Radius:  0.40 cm MV E velocity: 65.30 cm/s MV A velocity: 102.00 cm/s MV E/A ratio:  0.64 Mihai Croitoru MD Electronically signed by Sanda Klein MD Signature Date/Time: 10/28/2020/10:03:40 AM    Final     Discharge Instructions: Discharge Instructions     Diet - Renal Diet with 1200 ml Fluid limit.    Complete by: As directed    Increase activity slowly   Complete by: As directed        Signed: Armando Reichert, MD 10/31/2020, 11:23 AM   Pager: [phone]

## 2020-10-30 ENCOUNTER — Inpatient Hospital Stay (HOSPITAL_COMMUNITY): Payer: 59

## 2020-10-30 LAB — RENAL FUNCTION PANEL
Albumin: 2.6 g/dL — ABNORMAL LOW (ref 3.5–5.0)
Anion gap: 10 (ref 5–15)
BUN: 20 mg/dL (ref 6–20)
CO2: 27 mmol/L (ref 22–32)
Calcium: 9.4 mg/dL (ref 8.9–10.3)
Chloride: 95 mmol/L — ABNORMAL LOW (ref 98–111)
Creatinine, Ser: 4.62 mg/dL — ABNORMAL HIGH (ref 0.44–1.00)
GFR, Estimated: 11 mL/min — ABNORMAL LOW (ref >60)
Glucose, Bld: 108 mg/dL — ABNORMAL HIGH (ref 70–99)
Phosphorus: 3.7 mg/dL (ref 2.5–4.6)
Potassium: 3.6 mmol/L (ref 3.5–5.1)
Sodium: 132 mmol/L — ABNORMAL LOW (ref 135–145)

## 2020-10-30 LAB — CBC
HCT: 29.6 % — ABNORMAL LOW (ref 36.0–46.0)
Hemoglobin: 9.6 g/dL — ABNORMAL LOW (ref 12.0–15.0)
MCH: 29.2 pg (ref 26.0–34.0)
MCHC: 32.4 g/dL (ref 30.0–36.0)
MCV: 90 fL (ref 80.0–100.0)
Platelets: 257 10*3/uL (ref 150–400)
RBC: 3.29 MIL/uL — ABNORMAL LOW (ref 3.87–5.11)
RDW: 14.3 % (ref 11.5–15.5)
WBC: 6.2 10*3/uL (ref 4.0–10.5)
nRBC: 0 % (ref 0.0–0.2)

## 2020-10-30 MED ORDER — TECHNETIUM TC 99M TETROFOSMIN IV KIT
31.0000 | PACK | Freq: Once | INTRAVENOUS | Status: AC | PRN
  Administered 2020-10-30: 31 via INTRAVENOUS

## 2020-10-30 MED ORDER — REGADENOSON 0.4 MG/5ML IV SOLN
0.4000 mg | Freq: Once | INTRAVENOUS | Status: AC
  Filled 2020-10-30: qty 5

## 2020-10-30 MED ORDER — TECHNETIUM TC 99M TETROFOSMIN IV KIT
10.0000 | PACK | Freq: Once | INTRAVENOUS | Status: AC | PRN
  Administered 2020-10-30: 10 via INTRAVENOUS

## 2020-10-30 MED ORDER — REGADENOSON 0.4 MG/5ML IV SOLN
INTRAVENOUS | Status: AC
  Administered 2020-10-30: 0.4 mg via INTRAVENOUS
  Filled 2020-10-30: qty 5

## 2020-10-30 NOTE — Progress Notes (Signed)
No ischemia/infarction on nuclear stress test. Stress LVEF, in factr, looks better than echo. 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 Looked fairly compensated today. Blood pressure soft since stress test. Will hold off starting GDMT for heart failure for now. Will arrange outpatient f/u  Cardiology signing off. Please call us back, in case of any questions.   Nigel Mormon, MD Pager: [phone] Office: [phone]

## 2020-10-30 NOTE — Consult Note (Signed)
CARDIOLOGY CONSULT NOTE  Patient ID: Meredith Reed MRN: [id number] DOB/AGE: 1966/03/01 55 y.o.  Admit date: 10/27/2020 Referring Physician  Lucious Groves, DO Primary Physician:  Natividad Brood Reason for Consultation  New onset heart failure   Patient ID: Meredith Reed, female    DOB: [birth year], 55 y.o.   MRN: [id number]  Chief Complaint  Patient presents with  . Shortness of Breath   HPI:    Meredith Reed  is a 55 y.o. 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  Denies history of family premature coronary artery disease, hyperlipidemia, diabetes mellitus, MI/TIA/CVA, tobacco use.  Patient is presently on kidney transplant list at [facility].  Patient was admitted at [facility] hospital 10/20/2020 - 10/22/2020 and underwent thoracentesis for bilateral pleural effusions.  She then presented to [facility] emergency department on 10/27/2020 with complaints of worsening shortness of breath, orthopnea, and abdominal distention.  Evaluation in the emergency department revealed chest x-ray with vascular congestion, chest CT concerning for small bilateral pleural effusions, improved compared to last admission, as well as echocardiogram with new systolic heart failure with LVEF 25-30%.  Serial troponins be elevated but flat at 25 and 26.  Since admission patient has been evaluated by pulmonology, who does not recommend thoracentesis at this time.  Nephrology is following closely as well.  Patient underwent hemodialysis yesterday and is net -7.7 L since admission.  Nephrology is also recommended lowering EDW to prevent further reaccumulation of pleural effusions.  At the time of my exam this morning patient reports significant improvement of shortness of breath and complete resolution of orthopnea.  Denies chest pain, palpitations, dizziness, syncope, near syncope.   Denies leg swelling, PND.  Past Medical History:  Diagnosis Date  . Arthritis    knees  . Breast density   . Chronic kidney disease   . Hypertension    Past Surgical History:  Procedure Laterality Date  . BREAST REDUCTION SURGERY  09/16/2011   Procedure: MAMMARY REDUCTION  (BREAST);  Surgeon: Charlene Brooke, MD;  Location: [facility];  Service: Plastics;  Laterality: Bilateral;  . NO PAST SURGERIES     Family History  Problem Relation Age of Onset  . Emphysema Father   . Hypertension Sister        x2  . Deep vein thrombosis Sister   . Stroke Sister   . Deep vein thrombosis Paternal Grandmother   . Diabetes Paternal Grandmother   . Cancer Paternal Grandfather        ? type  . Diabetes Paternal Grandfather   . Diabetes Mother   . Diabetes Maternal Aunt   . Diabetes Maternal Uncle   . Diabetes Maternal Uncle    Social History   Tobacco Use  . Smoking status: Never Smoker  . Smokeless tobacco: Never Used  Substance Use Topics  . Alcohol use: No    Marital Sttus: Married  ROS  Review of Systems  Constitutional: Negative for malaise/fatigue and weight gain.  Cardiovascular: Negative for chest pain, claudication, leg swelling, near-syncope, orthopnea (resolved), palpitations, paroxysmal nocturnal dyspnea and syncope.  Respiratory: Negative for shortness of breath (improved).   Hematologic/Lymphatic: Does not bruise/bleed easily.  Gastrointestinal: Negative for melena.  Neurological: Negative for dizziness and weakness.  All other systems reviewed and are negative.  Objective   Vitals with BMI 10/30/2020 10/29/2020 10/29/2020  Height - - -  Weight - - -  BMI - - -  Systolic [id number]  Diastolic 56 76 74  Pulse 65 73 81    Blood pressure (!) 131/56, pulse 65, temperature 98.7 F (37.1 C), temperature source Oral, resp. rate 16, height '5\' 6"'$  (1.676 m), weight 74.7 kg, last menstrual period 07/28/2012, SpO2 98 %.    Physical Exam Vitals reviewed.   Constitutional:      Comments: Dialysis access  HENT:     Head: Normocephalic and atraumatic.     Right Ear: External ear normal.     Left Ear: External ear normal.     Nose: Nose normal.     Mouth/Throat:     Mouth: Mucous membranes are moist.  Eyes:     Extraocular Movements: Extraocular movements intact.     Conjunctiva/sclera: Conjunctivae normal.  Neck:     Vascular: No carotid bruit.  Cardiovascular:     Rate and Rhythm: Normal rate and regular rhythm.     Pulses: Intact distal pulses.          Carotid pulses are 2+ on the right side and 2+ on the left side.      Radial pulses are 2+ on the right side and 2+ on the left side.       Femoral pulses are 2+ on the right side and 2+ on the left side.      Popliteal pulses are 2+ on the right side and 2+ on the left side.       Dorsalis pedis pulses are 2+ on the right side and 2+ on the left side.       Posterior tibial pulses are 2+ on the right side and 2+ on the left side.     Heart sounds: S1 normal and S2 normal. No murmur heard. No gallop.   Pulmonary:     Effort: Pulmonary effort is normal. No respiratory distress.     Breath sounds: No wheezing, rhonchi or rales.     Comments: Decreased breath sounds bilateral lower lungs. Abdominal:     General: Bowel sounds are normal. There is no distension.     Palpations: Abdomen is soft.  Musculoskeletal:     Right lower leg: No edema.     Left lower leg: No edema.  Neurological:     General: No focal deficit present.     Mental Status: She is alert and oriented to person, place, and time.  Psychiatric:        Mood and Affect: Mood normal.        Behavior: Behavior normal.    Laboratory examination:   Recent Labs    03/16/20 1402 04/11/20 1358 10/28/20 0038 10/29/20 0531 10/30/20 0126  NA 131*   < > 135 135 132*  K 3.5   < > 4.1 3.8 3.6  CL 91*   < > 97* 99 95*  CO2 25   < > '29 27 27  '$ GLUCOSE 97   < > 101* 96 108*  BUN 100*   < > '7 18 20  '$ CREATININE 10.79*    < > 3.43* 5.13* 4.62*  CALCIUM 9.1  8.9   < > 9.3 9.2 9.4  GFRNONAA 4*   < > 15* 9* 11*  GFRAA 4*  --   --   --   --    < > = values in this interval not displayed.   estimated creatinine clearance is 14.2 mL/min (A) (by C-G formula based on SCr of 4.62 mg/dL (H)).  CMP Latest Ref Rng & Units 10/30/2020 10/29/2020 10/28/2020  Glucose 70 - 99 mg/dL 108(H) 96 101(H)  BUN 6 - 20 mg/dL '20 18 7  '$ Creatinine 0.44 - 1.00 mg/dL 4.62(H) 5.13(H) 3.43(H)  Sodium 135 - 145 mmol/L 132(L) 135 135  Potassium 3.5 - 5.1 mmol/L 3.6 3.8 4.1  Chloride 98 - 111 mmol/L 95(L) 99 97(L)  CO2 22 - 32 mmol/L '27 27 29  '$ Calcium 8.9 - 10.3 mg/dL 9.4 9.2 9.3  Total Protein 6.5 - 8.1 g/dL - - 6.5  Total Bilirubin 0.3 - 1.2 mg/dL - - 0.6  Alkaline Phos 38 - 126 U/L - - 79  AST 15 - 41 U/L - - 21  ALT 0 - 44 U/L - - 23   CBC Latest Ref Rng & Units 10/30/2020 10/29/2020 10/28/2020  WBC 4.0 - 10.5 K/uL 6.2 5.6 7.2  Hemoglobin 12.0 - 15.0 g/dL 9.6(L) 9.0(L) 10.0(L)  Hematocrit 36.0 - 46.0 % 29.6(L) 27.9(L) 31.3(L)  Platelets 150 - 400 K/uL 257 215 200   Lipid Panel No results for input(s): CHOL, TRIG, LDLCALC, VLDL, HDL, CHOLHDL, LDLDIRECT in the last 8760 hours.  HEMOGLOBIN A1C No results found for: HGBA1C, MPG TSH Recent Labs    10/27/20 1726  TSH 1.941   BNP (last 3 results) No results for input(s): BNP in the last 8760 hours. Results for orders placed or performed during the hospital encounter of 10/27/20 (from the past 48 hour(s))  CBC     Status: Abnormal   Collection Time: 10/29/20  5:31 AM  Result Value Ref Range   WBC 5.6 4.0 - 10.5 K/uL   RBC 3.03 (L) 3.87 - 5.11 MIL/uL   Hemoglobin 9.0 (L) 12.0 - 15.0 g/dL   HCT 27.9 (L) 36.0 - 46.0 %   MCV 92.1 80.0 - 100.0 fL   MCH 29.7 26.0 - 34.0 pg   MCHC 32.3 30.0 - 36.0 g/dL   RDW 14.7 11.5 - 15.5 %   Platelets 215 150 - 400 K/uL   nRBC 0.0 0.0 - 0.2 %    Comment: Performed at [facility], [place] [address], [place], [place] [address]  Renal function  panel     Status: Abnormal   Collection Time: 10/29/20  5:31 AM  Result Value Ref Range   Sodium 135 135 - 145 mmol/L   Potassium 3.8 3.5 - 5.1 mmol/L   Chloride 99 98 - 111 mmol/L   CO2 27 22 - 32 mmol/L   Glucose, Bld 96 70 - 99 mg/dL    Comment: Glucose reference range applies only to samples taken after fasting for at least 8 hours.   BUN 18 6 - 20 mg/dL   Creatinine, Ser 5.13 (H) 0.44 - 1.00 mg/dL   Calcium 9.2 8.9 - 10.3 mg/dL   Phosphorus 3.9 2.5 - 4.6 mg/dL   Albumin 2.4 (L) 3.5 - 5.0 g/dL   GFR, Estimated 9 (L) >60 mL/min    Comment: (NOTE) Calculated using the CKD-EPI Creatinine Equation (2021)    Anion gap 9 5 - 15    Comment: Performed at [facility] [address] [place], [place] [address]  Protime-INR     Status: None   Collection Time: 10/29/20  5:31 AM  Result Value Ref Range   Prothrombin Time 13.5 11.4 - 15.2 seconds   INR 1.0 0.8 - 1.2    Comment: (NOTE) INR goal varies based on device and disease states. Performed at [facility],  [address], [place], [place] [address]   Renal function panel     Status: Abnormal   Collection Time: 10/30/20  1:26 AM  Result Value Ref Range   Sodium 132 (L) 135 - 145 mmol/L   Potassium 3.6 3.5 - 5.1 mmol/L   Chloride 95 (L) 98 - 111 mmol/L   CO2 27 22 - 32 mmol/L   Glucose, Bld 108 (H) 70 - 99 mg/dL    Comment: Glucose reference range applies only to samples taken after fasting for at least 8 hours.   BUN 20 6 - 20 mg/dL   Creatinine, Ser 4.62 (H) 0.44 - 1.00 mg/dL   Calcium 9.4 8.9 - 10.3 mg/dL   Phosphorus 3.7 2.5 - 4.6 mg/dL   Albumin 2.6 (L) 3.5 - 5.0 g/dL   GFR, Estimated 11 (L) >60 mL/min    Comment: (NOTE) Calculated using the CKD-EPI Creatinine Equation (2021)    Anion gap 10 5 - 15    Comment: Performed at [facility] [address] [place], [place] [address]  CBC     Status: Abnormal   Collection Time: 10/30/20  1:26 AM  Result Value Ref Range   WBC 6.2 4.0 - 10.5 K/uL    RBC 3.29 (L) 3.87 - 5.11 MIL/uL   Hemoglobin 9.6 (L) 12.0 - 15.0 g/dL   HCT 29.6 (L) 36.0 - 46.0 %   MCV 90.0 80.0 - 100.0 fL   MCH 29.2 26.0 - 34.0 pg   MCHC 32.4 30.0 - 36.0 g/dL   RDW 14.3 11.5 - 15.5 %   Platelets 257 150 - 400 K/uL   nRBC 0.0 0.0 - 0.2 %    Comment: Performed at [facility], [place] [address], [place], [place] [address]    Medications and allergies   Allergies  Allergen Reactions  . Lisinopril Cough     Current Meds  Medication Sig  . acetaminophen (TYLENOL) 500 MG tablet Take 500-1,000 mg by mouth every 6 (six) hours as needed for mild pain (or headaches).  . ALPRAZolam (XANAX) 0.25 MG tablet Take 0.25 mg by mouth daily as needed for anxiety.  Marland Kitchen amLODipine (NORVASC) 10 MG tablet Take 10 mg by mouth daily.  . B Complex-C-Folic Acid (RENA-VITE RX) 1 MG TABS Take 1 tablet by mouth in the morning.  Marland Kitchen buPROPion (WELLBUTRIN XL) 300 MG 24 hr tablet Take 300 mg by mouth in the morning.  . DULoxetine (CYMBALTA) 60 MG capsule Take 60 mg by mouth in the morning and at bedtime.  . Homeopathic Products ([place]) LIQD Apply 1 application topically See admin instructions. Apply to bilateral legs at bedtime  . labetalol (NORMODYNE) 200 MG tablet Take 200 mg by mouth every 12 (twelve) hours.  Marland Kitchen losartan (COZAAR) 100 MG tablet Take 100 mg by mouth every evening.  . sevelamer carbonate (RENVELA) 800 MG tablet Take 800-2,400 mg by mouth See admin instructions. Take 2,400 mg by mouth up to three times a day with meals and 800-1,600 mg with each snack    Scheduled Meds: . amLODipine  10 mg Oral Daily  . Chlorhexidine Gluconate Cloth  6 each Topical Q0600  . feeding supplement (NEPRO CARB STEADY)  237 mL Oral BID BM  . heparin  5,000 Units Subcutaneous Q8H  . labetalol  200 mg Oral Q12H  . losartan  100 mg Oral QPM  . multivitamin  1 tablet Oral QHS   Continuous Infusions: PRN Meds:.acetaminophen **OR** acetaminophen, guaiFENesin-dextromethorphan,  senna-docusate  I/O last 3 completed shifts: In: 360 [P.O.:360] Out: 3000 [Other:3000] No intake/output data recorded.    Radiology:   Chest x-ray 10/27/2020: 1. Increased perihilar densities bilaterally particularly in the left suprahilar region. Findings could represent central vascular congestion or developing infection. Recommend follow-up imaging. 2. Left basilar chest densities. Findings may represent a combination of consolidation and left pleural fluid. 3. Stable position of the dialysis catheter.  CT abdomen pelvis 10/27/2020: 1. Persistent bilateral pleural effusions that may have slightly enlarged since 10/21/2020. These pleural effusions are moderate in size, right side greater than left. 2. 3 mm nodule in the left lower lobe is indeterminate. No follow-up needed if patient is low-risk. Non-contrast chest CT can be considered in 12 months if patient is high-risk. This recommendation follows the consensus statement: Guidelines for Management of Incidental Pulmonary Nodules Detected on CT Images: From the Fleischner Society 2017; Radiology 2017; 284:228-243. 3. No acute abnormality in the abdomen or pelvis. 4. Again noted is fat stranding in the right upper abdomen possibly related an evolving or resolving omental infarct. 5. Stable appearance of the peritoneal dialysis catheter. Small amount of free fluid is the likely associated with the peritoneal dialysis catheter.  Cardiac Studies:   Echocardiogram 10/27/2020: 1. Left ventricular ejection fraction, by estimation, is 20 to 25%. The  left ventricle has severely decreased function. The left ventricle  demonstrates global hypokinesis. There is mild concentric left ventricular  hypertrophy. Left ventricular diastolic  parameters are consistent with Grade I diastolic dysfunction (impaired  relaxation).  2. Right ventricular systolic function is normal. The right ventricular  size is normal. Tricuspid  regurgitation signal is inadequate for assessing  PA pressure.  3. Left atrial size was mild to moderately dilated.  4. A small pericardial effusion is present. The pericardial effusion is  circumferential. There is no evidence of cardiac tamponade.  5. The mitral valve is normal in structure. Moderate mitral valve  regurgitation.  6. The aortic valve is normal in structure. Aortic valve regurgitation is  not visualized. No aortic stenosis is present.  7. The inferior vena cava is normal in size with <50% respiratory  variability, suggesting right atrial pressure of 8 mmHg.   EKG: 10/27/2020: Sinus rhythm at a rate of 80 bpm.  Normal axis.  Poor refreshing, cannot exclude anteroseptal infarct old.  Lateral T wave inversions.  Compared to EKG 08/01/2020, lateral T wave inversions new.  Telemetry:  Normal sinus rhythm   Assessment   WINNONA CALVINO  is a 55 y.o. 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  Denies history of family premature coronary artery disease, hyperlipidemia, diabetes mellitus, MI/TIA/CVA, tobacco use.  Patient is presently on kidney transplant list at [facility].  Presented to [facility] emergency department with shortness of breath and orthopnea, further evaluation revealed new onset systolic heart failure with LVEF 25-30%.  Acute systolic heart failure with LVEF 25-30% End-stage renal disease on hemodialysis Hypertension Recurrent bilateral pleural effusion   Recommendations:   Acute systolic heart failure with LVEF 25-30% Patient presented with acute volume overload and echocardiogram revealed new onset systolic heart failure with significantly reduced LVEF.  SPECT both end-stage renal disease as well as heart failure to be underlying etiology of recurrent bilateral pleural effusions and volume overload.  Patient echocardiogram  findings are suggestive of nonischemic cardiomyopathy. Notably patient's troponin was mildly elevated but remain flat. Likely secondary to acute heart failure and volume  overload. Although patient's EKG did reveal lateral T wave inversions, cannot exclude ischemia. Will plan for nuclear stress test inpatient to evaluate for underlying ischemia.   Will plan to initiated guideline directed medical therapy in collaboration with patient's nephrology team. Patient is presently on losartan and labetalol, will continue this for now.  End-stage renal disease on hemodialysis Recurrent volume overload is likely also related to underlying ESRD. Presently managed by nephrology who recommends lowering EDW. Patient underwent extra dialysis yesterday which led to significant improvement of symptoms.   Hypertension Well controlled presently. Will continue amlodipine, labetalol, and losartan at this time.   Recurrent bilateral pleural effusion Suspect this is secondary to underlying heart failure and renal failure. Initiation of guideline directed medical therapy for heart failure as well as additional diuretic therapy may be beneficial. Will consider in collaboration with nephrology. Patient's EDW has also been lowered by nephrology to prevent re-accumulation of pleural effusions.   Will will continue to follow. Thank you for involving [place] in the care of this patient.    Alethia Berthold, PA-C 10/30/2020, 7:40 AM Office: [phone]

## 2020-10-30 NOTE — Discharge Instructions (Signed)
To Meredith Reed,  It was a pleasure taking care of you during your stay at [facility]. You were admitted for fluid accumulation that was leading you to be short of breath. You underwent several dialysis sessions to remove that excess fluid. During your stay, your heart imaging showed decreased pump function, and cardiology saw you to evaluate your heart. After your evaluation, it is recommended that you stay on your current medications and follow up with cardiology outpatient. If you feel short of breath, dizzy, or notice a change in mental status please go the nearest emergency department for evaluation.     Pleural Effusion Pleural effusion is an abnormal buildup of fluid in the layers of tissue between the lungs and the inside of the chest (pleural space). The two layers of tissue that line the lungs and the inside of the chest are called pleura. Usually, there is no air in the space between the pleura, only a thin layer of fluid. Some conditions can cause a large amount of fluid to build up, which can cause the lung to collapse if untreated. A pleural effusion is usually caused by another disease that requires treatment. What are the causes? Pleural effusion can be caused by:  Heart failure.  Certain infections, such as pneumonia or tuberculosis.  Cancer.  A blood clot in the lung (pulmonary embolism).  Complications from surgery, such as from open heart surgery.  Liver disease (cirrhosis).  Kidney disease. What are the signs or symptoms? In some cases, pleural effusion may cause no symptoms. If symptoms are present, they may include:  Shortness of breath, especially when lying down.  Chest pain. This may get worse when taking a deep breath.  Fever.  Dry, long-lasting (chronic) cough.  Hiccups.  Rapid breathing. An underlying condition that is causing the pleural effusion (such as heart failure, pneumonia, blood clots, tuberculosis, or cancer) may also cause other  symptoms. How is this diagnosed? This condition may be diagnosed based on:  Your symptoms and medical history.  A physical exam.  A chest X-ray.  A procedure to use a needle to remove fluid from the pleural space (thoracentesis). This fluid is tested.  Other imaging studies of the chest, such as ultrasound or CT scan. How is this treated? Depending on the cause of your condition, treatment may include:  Treating the underlying condition that is causing the effusion. When that condition improves, the effusion will also improve. Examples of treatment for underlying conditions include: ? Antibiotic medicines to treat an infection. ? Diuretics or other heart medicines to treat heart failure.  Thoracentesis.  Placing a thin flexible tube under your skin and into your chest to continuously drain the effusion (indwelling pleural catheter).  Surgery to remove the outer layer of tissue from the pleural space (decortication).  A procedure to put medicine into the chest cavity to seal the pleural space and prevent fluid buildup (pleurodesis).  Chemotherapy and radiation therapy, if you have cancerous (malignant) pleural effusion. These treatments are typically used to treat cancer. They kill certain cells in the body.   Follow these instructions at home:  Take over-the-counter and prescription medicines only as told by your health care provider.  Ask your health care provider what activities are safe for you.  Keep track of how long you are able to do mild exercise (such as walking) before you get short of breath. Write down this information to share with your health care provider. Your ability to exercise should improve over time.  Do not use any products that contain nicotine or tobacco, such as cigarettes and e-cigarettes. If you need help quitting, ask your health care provider.  Keep all follow-up visits as told by your health care provider. This is important. Contact a health care  provider if:  The amount of time that you are able to do mild exercise: ? Decreases. ? Does not improve with time.  You have a fever. Get help right away if:  You are short of breath.  You develop chest pain.  You develop a new cough. Summary  Pleural effusion is an abnormal buildup of fluid in the layers of tissue between the lungs and the inside of the chest.  Pleural effusion can have many causes, including heart failure, pulmonary embolism, infections, or cancer.  Symptoms of pleural effusion can include shortness of breath, chest pain, fever, long-lasting (chronic) cough, hiccups, or rapid breathing.  Diagnosis often involves making images of the chest (such as with ultrasound or X-ray) and removing fluid (thoracentesis) to send for testing.  Treatment for pleural effusion depends on what underlying condition is causing it. This information is not intended to replace advice given to you by your health care provider. Make sure you discuss any questions you have with your health care provider. Document Revised: 03/16/2020 Document Reviewed: 02/16/2020 Elsevier Patient Education  2021 [employer].

## 2020-10-30 NOTE — Progress Notes (Signed)
   Subjective: No acute overnight events.  Pt doing better. Denies any Chest pain, SOB. Denies any complaints or concerns.    Objective:  Vital signs in last 24 hours: Vitals:   10/29/20 1325 10/29/20 1830 10/29/20 2310 10/30/20 0507  BP: (!) 132/91 108/74 115/76 (!) 131/56  Pulse: 75 81 73 65  Resp: '18 16 18 16  '$ Temp: 97.7 F (36.5 C) 99.5 F (37.5 C) 99.1 F (37.3 C) 98.7 F (37.1 C)  TempSrc: Oral Oral Oral Oral  SpO2: 97% 97% 98% 98%  Weight:      Height:         Physical Exam Pt examined lying in the bed. Cardiac: RRR, no rubs, murmurs or gallops Pulm: clear to auscultation bilaterally from anterior lung fields Abd: soft, nontender, nondistended, BS present Ext: warm and well perfused. No pedal edema   Assessment/Plan: Meredith Reed is a 55 y.o. female with hx of ESRD on HD, HTN presenting for shortness of breath, found to have recurrent bilateral pleural effusion, right greater than left.  She was seen for the same at [facility] from 4/23-4/25, felt better after thoracentesis and additional hemodialysis treatment. S/P 1 Full HD (Saturday)and 1 short HD (Sunday).   Principal Problem:   Acute HFrEF (heart failure with reduced ejection fraction) (HCC) Active Problems:   ESRD (end stage renal disease) (HCC)   Pleural effusion  # Recurrent bilateral pleural effusion, transudative # Lung nodule    Likely due to fluid overload and reduced EF. She received HD session on Sat and short session on Sunday and has been feeling better since then.  Denies any chest pain, and SOB.  - Pulmonology consulted, appreciate recommendations. - Doesn't recommend thoracentesis at this time. - ECHO showed decreased LV ejection fraction of 20-25%. - Trend RFP.  # ESRD on HD Monday Wednesday Friday # Normocytic anemia Pt had Extra HD on Saturday and short HD Sunday. Nephrology lowering EDW to prevent reaccumulation. 3L fluid removed yesterday. Total Net loss since admission  -7.7 L.  Hb stable at 9.6, HCT- 29.6 -Nephrology following, Appreciate recommendations. -HD per nephrology. - Sevelamer and others per nephrology.   # HFrEF (EF of 20-25%) New T wave inversions and Q wave on EKG  ECHO showed decreased LV ejection fraction of 20-25%. Left ventricle demonstrates global kinesis. Consistent with Grade I diastolic dysfunction  (impaired relaxation). LA size mild to moderately dilated. Troponin low at 25>26 - Cardiology Consulted. Appreciate Recs. -Completed Nuclear Stress test- Results pending.  #HTN BP stable 131/56 mmHg.  Continue Home medications of amlodipine 10 mg, losartan 100 mg, labetolol '200mg'$  BID.   Diet: Renal with 1200 ml Fluid restriction IVF:  None VTE:  Heparin Prior to Admission Living Arrangement:  Home Anticipated Discharge Location:  Home Barriers to Discharge:  Medical workup Dispo: Anticipated discharge in approximately 1-2 day(s).   Armando Reichert, MD 10/30/2020, 6:25 AM On Call pager [phone]

## 2020-10-30 NOTE — Progress Notes (Signed)
[facility] Progress Note   Subjective:    Completed dialysis yesterday net UF 3L   Had stress test this am Feels much better. No cp, sob    Objective Vitals:   10/29/20 1325 10/29/20 1830 10/29/20 2310 10/30/20 0507  BP: (!) 132/91 108/74 115/76 (!) 131/56  Pulse: 75 81 73 65  Resp: '18 16 18 16  '$ Temp: 97.7 F (36.5 C) 99.5 F (37.5 C) 99.1 F (37.3 C) 98.7 F (37.1 C)  TempSrc: Oral Oral Oral Oral  SpO2: 97% 97% 98% 98%  Weight:      Height:       Physical Exam General: WDWN female, alert and in NAD Heart: RRR, no murmur Lungs: Clear, bilaterally No wheezes, rales, or rhonchi. Abdomen: Soft, non-tender, non-distended No obvious abdominal masses. PD cath in R lower abdomen with no redness, drainage or TTP Extremities: No edema b/l lower extremities Dialysis Access: R IJ TDC with clean, dry bandage.   Additional Objective Labs: Basic Metabolic Panel: Recent Labs  Lab 10/28/20 0038 10/29/20 0531 10/30/20 0126  NA 135 135 132*  K 4.1 3.8 3.6  CL 97* 99 95*  CO2 '29 27 27  '$ GLUCOSE 101* 96 108*  BUN '7 18 20  '$ CREATININE 3.43* 5.13* 4.62*  CALCIUM 9.3 9.2 9.4  PHOS  --  3.9 3.7   Liver Function Tests: Recent Labs  Lab 10/27/20 1123 10/27/20 1726 10/28/20 0038 10/29/20 0531 10/30/20 0126  AST '21 22 21  '$ --   --   ALT '24 23 23  '$ --   --   ALKPHOS 73 77 79  --   --   BILITOT 1.1 0.8 0.6  --   --   PROT 6.0* 6.0* 6.5  --   --   ALBUMIN 2.8* 2.7* 2.8* 2.4* 2.6*   CBC: Recent Labs  Lab 10/27/20 1123 10/28/20 0038 10/29/20 0531 10/30/20 0126  WBC 6.4 7.2 5.6 6.2  NEUTROABS 4.1  --   --   --   HGB 9.3* 10.0* 9.0* 9.6*  HCT 29.1* 31.3* 27.9* 29.6*  MCV 91.8 92.1 92.1 90.0  PLT 177 200 215 257    Studies/Results:  Medications:  . amLODipine  10 mg Oral Daily  . Chlorhexidine Gluconate Cloth  6 each Topical Q0600  . feeding supplement (NEPRO CARB STEADY)  237 mL Oral BID BM  . heparin  5,000 Units Subcutaneous Q8H  . labetalol  200  mg Oral Q12H  . losartan  100 mg Oral QPM  . multivitamin  1 tablet Oral QHS    Outpatient Dialysis Orders:  Center: [facility]  on MWF. 180NRe, 4 hours, BFR 400, DFR 500, EDW 83.5kg  2K/2C, TDC, heparin 2000 unit bolus, Hectorol 1 mcg IV q HD No ESA  Renvela 800 mg, 2 tabs PO TID with meals  BP meds: amlodipine '10mg'$  daily, losartan '100mg'$  daily  Assessment/Plan:  1. Shortness of breath: likely related to recurrent pleural effusions. CXR also showed increased perihilar densities suggestive of central vascular congestion. Suspect patient is losing weight and needs EDW lowered further to prevent re-accumulation of pleural effusons.  Pulmonology also consulted but not recommended thoracentesis at this time. SOB improving with HD. Now below OP dry weight. Will need lower EDW at discharge.  2. HFrEF: Echo 4/30 -- EF 20-25% severely decreased function. Volume improving with HD.  Cardiology consulted  3. ESRD:  Dialyzes on MWF schedule. Had extra HD on 4/30, 5/1 for volume removal. Back on schedule. Will need  PD cath removed outpatient. Next HD 5/4.  4. Hypertension: BP elevated, Resume home meds. Further volume reduction with HD as above. Improving.  5. Anemia: Hgb 9.0. Start ESA if remains in hospital.  6. Metabolic bone disease: Corrected calcium is high, will hold VDRA for now. Continue binders and monitor phosphorus level.  7. Nutrition: renal diet/1.2L fluid restrictions and protein supplements.     Lynnda Child PA-C [facility] 10/30/2020,9:43 AM

## 2020-10-31 DIAGNOSIS — I10 Essential (primary) hypertension: Secondary | ICD-10-CM

## 2020-10-31 LAB — RENAL FUNCTION PANEL
Albumin: 2.5 g/dL — ABNORMAL LOW (ref 3.5–5.0)
Anion gap: 12 (ref 5–15)
BUN: 32 mg/dL — ABNORMAL HIGH (ref 6–20)
CO2: 27 mmol/L (ref 22–32)
Calcium: 9.2 mg/dL (ref 8.9–10.3)
Chloride: 91 mmol/L — ABNORMAL LOW (ref 98–111)
Creatinine, Ser: 7.16 mg/dL — ABNORMAL HIGH (ref 0.44–1.00)
GFR, Estimated: 6 mL/min — ABNORMAL LOW (ref >60)
Glucose, Bld: 110 mg/dL — ABNORMAL HIGH (ref 70–99)
Phosphorus: 4.6 mg/dL (ref 2.5–4.6)
Potassium: 3.5 mmol/L (ref 3.5–5.1)
Sodium: 130 mmol/L — ABNORMAL LOW (ref 135–145)

## 2020-10-31 MED ORDER — DARBEPOETIN ALFA 40 MCG/0.4ML IJ SOSY
PREFILLED_SYRINGE | INTRAMUSCULAR | Status: AC
  Filled 2020-10-31: qty 0.4

## 2020-10-31 MED ORDER — DARBEPOETIN ALFA 40 MCG/0.4ML IJ SOSY
40.0000 ug | PREFILLED_SYRINGE | INTRAMUSCULAR | Status: AC
  Administered 2020-10-31: 40 ug via INTRAVENOUS
  Filled 2020-10-31: qty 0.4

## 2020-10-31 MED ORDER — HEPARIN SODIUM (PORCINE) 1000 UNIT/ML IJ SOLN
INTRAMUSCULAR | Status: AC
  Filled 2020-10-31: qty 4

## 2020-10-31 MED ORDER — HEPARIN SODIUM (PORCINE) 1000 UNIT/ML IJ SOLN
3200.0000 [IU] | Freq: Once | INTRAMUSCULAR | Status: AC
  Administered 2020-10-31: 3200 [IU]

## 2020-10-31 NOTE — Progress Notes (Signed)
Patient returned from HD, a/ox4, denied pain. VSS. HD site CDI. Patient with discharge order. Family at bedside to transport patient home.   AVS reviewed with patient/sister (at bedside). All questions answered at this time.

## 2020-10-31 NOTE — Progress Notes (Signed)
Offer to assist patient to chair and with ADLs, patient verbalized " not right now". Will re-attempt. Patient denied pain, appears in no distress resting in bed.

## 2020-10-31 NOTE — Progress Notes (Signed)
[facility] Progress Note   Subjective:    Seen in room. No events overnight, slept well No cp, sob. For dialysis today.    Objective Vitals:   10/30/20 1130 10/30/20 1700 10/31/20 0059 10/31/20 0506  BP: 96/62 123/75 115/69 118/79  Pulse: 68 74 74 72  Resp: '16 18 18 18  '$ Temp:  98 F (36.7 C) 98.5 F (36.9 C) 98.6 F (37 C)  TempSrc:  Oral Oral Oral  SpO2:  99% 99% 96%  Weight:    77.9 kg  Height:       Physical Exam General: WDWN female, alert and in NAD Heart: RRR, no murmur Lungs: Clear, bilaterally No wheezes, rales, or rhonchi. Abdomen: Soft, non-tender, non-distended No obvious abdominal masses. PD cath in R lower abdomen with no redness, drainage or TTP Extremities: No edema b/l lower extremities Dialysis Access: R IJ TDC with clean, dry bandage.   Additional Objective Labs: Basic Metabolic Panel: Recent Labs  Lab 10/29/20 0531 10/30/20 0126 10/31/20 0131  NA 135 132* 130*  K 3.8 3.6 3.5  CL 99 95* 91*  CO2 '27 27 27  '$ GLUCOSE 96 108* 110*  BUN 18 20 32*  CREATININE 5.13* 4.62* 7.16*  CALCIUM 9.2 9.4 9.2  PHOS 3.9 3.7 4.6   Liver Function Tests: Recent Labs  Lab 10/27/20 1123 10/27/20 1726 10/28/20 0038 10/29/20 0531 10/30/20 0126 10/31/20 0131  AST '21 22 21  '$ --   --   --   ALT '24 23 23  '$ --   --   --   ALKPHOS 73 77 79  --   --   --   BILITOT 1.1 0.8 0.6  --   --   --   PROT 6.0* 6.0* 6.5  --   --   --   ALBUMIN 2.8* 2.7* 2.8* 2.4* 2.6* 2.5*   CBC: Recent Labs  Lab 10/27/20 1123 10/28/20 0038 10/29/20 0531 10/30/20 0126  WBC 6.4 7.2 5.6 6.2  NEUTROABS 4.1  --   --   --   HGB 9.3* 10.0* 9.0* 9.6*  HCT 29.1* 31.3* 27.9* 29.6*  MCV 91.8 92.1 92.1 90.0  PLT 177 200 215 257    Studies/Results:  Medications:  . amLODipine  10 mg Oral Daily  . Chlorhexidine Gluconate Cloth  6 each Topical Q0600  . feeding supplement (NEPRO CARB STEADY)  237 mL Oral BID BM  . heparin  5,000 Units Subcutaneous Q8H  . labetalol  200  mg Oral Q12H  . losartan  100 mg Oral QPM  . multivitamin  1 tablet Oral QHS    Outpatient Dialysis Orders:  Center: [facility]  on MWF. 180NRe, 4 hours, BFR 400, DFR 500, EDW 83.5kg  2K/2C, TDC, heparin 2000 unit bolus, Hectorol 1 mcg IV q HD No ESA  Renvela 800 mg, 2 tabs PO TID with meals  BP meds: amlodipine '10mg'$  daily, losartan '100mg'$  daily  Assessment/Plan:  1. Shortness of breath: likely related to recurrent pleural effusions. CXR also showed increased perihilar densities suggestive of central vascular congestion. Suspect patient is losing weight and needs EDW lowered further to prevent re-accumulation of pleural effusons.  Pulmonology also consulted but not recommended thoracentesis at this time. SOB improving with HD. Now below OP dry weight. Will need lower EDW at discharge.  2. HFrEF: Echo 4/30 -- EF 20-25% severely decreased function. Volume status improved with HD.  Cardiology consulted. No ischemia on stress test. Will follow as OP.  3. ESRD:  Dialyzes  on MWF schedule. Had extra HD on 4/30, 5/1 for volume removal. Back on schedule. Will need PD cath removed outpatient. HD today.  4. Hypertension: BP improved.  Resumed home meds. Further volume reduction with HD as above. Improving.  5. Anemia: Hgb 9.6. Will order Aranesp 50 with HD today.  6. Metabolic bone disease: Corrected calcium is high, will hold VDRA for now. Continue binders and monitor phosphorus level.  7. Nutrition: renal diet/1.2L fluid restrictions and protein supplements.     Lynnda Child PA-C [facility] 10/31/2020,9:56 AM

## 2020-10-31 NOTE — Plan of Care (Signed)
  Problem: Education: Goal: Knowledge of General Education information will improve Description Including pain rating scale, medication(s)/side effects and non-pharmacologic comfort measures Outcome: Adequate for Discharge   Problem: Health Behavior/Discharge Planning: Goal: Ability to manage health-related needs will improve Outcome: Adequate for Discharge   

## 2020-11-01 ENCOUNTER — Telehealth: Payer: Self-pay | Admitting: Nephrology

## 2020-11-01 NOTE — Telephone Encounter (Signed)
Transition of care contact from inpatient facility  Date of Discharge: 10/31/20 Date of Contact: 5/5/220 Method of contact: Phone  Attempted to contact patient to discuss transition of care from inpatient admission. Patient did not answer the phone. Message was left on the patient's voicemail. Will attempt to follow up at dialysis center.

## 2020-11-14 ENCOUNTER — Other Ambulatory Visit: Payer: Self-pay

## 2020-11-14 ENCOUNTER — Encounter: Payer: Self-pay | Admitting: Vascular Surgery

## 2020-11-14 ENCOUNTER — Ambulatory Visit (INDEPENDENT_AMBULATORY_CARE_PROVIDER_SITE_OTHER): Payer: 59 | Admitting: Vascular Surgery

## 2020-11-14 ENCOUNTER — Ambulatory Visit: Payer: 59 | Admitting: Vascular Surgery

## 2020-11-14 VITALS — BP 150/90 | HR 66 | Temp 98.1°F | Resp 20 | Ht 66.0 in | Wt 177.8 lb

## 2020-11-14 DIAGNOSIS — Z992 Dependence on renal dialysis: Secondary | ICD-10-CM | POA: Diagnosis not present

## 2020-11-14 DIAGNOSIS — N186 End stage renal disease: Secondary | ICD-10-CM | POA: Diagnosis not present

## 2020-11-14 NOTE — Progress Notes (Signed)
REASON FOR VISIT:   To evaluate for an AV fistula.  The consult is requested by Dr. Corliss Parish  MEDICAL ISSUES:   END-STAGE RENAL DISEASE: I think her best chance for a fistula would be a right brachiocephalic fistula.  If this is not possible she would likely require placement of an AV graft.  I have again discussed this with her. I have explained the indications for placement of an AV fistula or AV graft. I've explained that if at all possible we will place an AV fistula.  I have reviewed the risks of placement of an AV fistula including but not limited to: failure of the fistula to mature, need for subsequent interventions, and thrombosis. In addition I have reviewed the potential complications of placement of an AV graft. These risks include, but are not limited to, graft thrombosis, graft infection, wound healing problems, bleeding, arm swelling, and steal syndrome. All the patient's questions were answered and they are agreeable to proceed with surgery.  Her surgery is scheduled for 11/20/2020 which is a nondialysis day.  She dialyzes on Monday Wednesdays and Fridays.  HPI:   Meredith Reed is a pleasant 55 y.o. female who I saw in consultation on 08/08/2020 for hemodialysis access.  We were asked to place a fistula and if a fistula were not possible we were asked to proceed with placement of an AV graft.  She had a catheter.  The catheter is in place for about 2 months and was in the right IJ.  She did not have any uremic symptoms.  She was also being considered for peritoneal dialysis.  She had some concerns as to why she would require access placement in her arm.  I explained that this is typically as a backup plan if she got peritonitis.  Based on her vein map I thought her only real option for a fistula would be a right brachiocephalic AV fistula if this were not possible she would require an AV graft.  Given that she had a catheter on the right side she was at slightly higher risk  for arm swelling on the right.  She dialyzed on Monday Wednesdays and Fridays.  I believe she has had some problems with infection with her peritoneal dialysis catheter no longer wants to do peritoneal dialysis.  She sent back for placement of a fistula.  She denies any recent uremic symptoms.  Past Medical History:  Diagnosis Date  . Arthritis    knees  . Breast density   . Chronic kidney disease   . Hypertension     Family History  Problem Relation Age of Onset  . Emphysema Father   . Hypertension Sister        x2  . Deep vein thrombosis Sister   . Stroke Sister   . Deep vein thrombosis Paternal Grandmother   . Diabetes Paternal Grandmother   . Cancer Paternal Grandfather        ? type  . Diabetes Paternal Grandfather   . Diabetes Mother   . Diabetes Maternal Aunt   . Diabetes Maternal Uncle   . Diabetes Maternal Uncle     SOCIAL HISTORY: Social History   Tobacco Use  . Smoking status: Never Smoker  . Smokeless tobacco: Never Used  Substance Use Topics  . Alcohol use: No    Allergies  Allergen Reactions  . Lisinopril Cough    Current Outpatient Medications  Medication Sig Dispense Refill  . acetaminophen (TYLENOL) 500 MG tablet  Take 500-1,000 mg by mouth every 6 (six) hours as needed for mild pain (or headaches).    . ALPRAZolam (XANAX) 0.25 MG tablet Take 0.25 mg by mouth daily as needed for anxiety.    Marland Kitchen amLODipine (NORVASC) 10 MG tablet Take 10 mg by mouth daily.    . B Complex-C-Folic Acid (RENA-VITE RX) 1 MG TABS Take 1 tablet by mouth in the morning.    Marland Kitchen buPROPion (WELLBUTRIN XL) 300 MG 24 hr tablet Take 300 mg by mouth in the morning.    . cholecalciferol (VITAMIN D) 1000 UNITS tablet Take 1,000 Units by mouth daily.    . DULoxetine (CYMBALTA) 60 MG capsule Take 60 mg by mouth in the morning and at bedtime.    . Homeopathic Products (Valier) LIQD Apply 1 application topically See admin instructions. Apply to bilateral legs at bedtime    .  labetalol (NORMODYNE) 200 MG tablet Take 200 mg by mouth every 12 (twelve) hours.    Marland Kitchen losartan (COZAAR) 100 MG tablet Take 100 mg by mouth every evening.    . magnesium gluconate (MAGONATE) 500 MG tablet Take 500 mg by mouth daily as needed (night time leg cramps).     . Multiple Vitamins-Minerals (MULTIVITAMIN WITH MINERALS) tablet Take 1 tablet by mouth daily.    . sevelamer carbonate (RENVELA) 800 MG tablet Take 800-2,400 mg by mouth See admin instructions. Take 2,400 mg by mouth up to three times a day with meals and 800-1,600 mg with each snack    . vitamin C (ASCORBIC ACID) 500 MG tablet Take 500 mg by mouth daily.     No current facility-administered medications for this visit.    REVIEW OF SYSTEMS:  '[X]'$  denotes positive finding, '[ ]'$  denotes negative finding Cardiac  Comments:  Chest pain or chest pressure:    Shortness of breath upon exertion:    Short of breath when lying flat: x   Irregular heart rhythm:        Vascular    Pain in calf, thigh, or hip brought on by ambulation:    Pain in feet at night that wakes you up from your sleep:     Blood clot in your veins:    Leg swelling:         Pulmonary    Oxygen at home:    Productive cough:     Wheezing:         Neurologic    Sudden weakness in arms or legs:     Sudden numbness in arms or legs:     Sudden onset of difficulty speaking or slurred speech:    Temporary loss of vision in one eye:     Problems with dizziness:         Gastrointestinal    Blood in stool:     Vomited blood:         Genitourinary    Burning when urinating:     Blood in urine:        Psychiatric    Major depression:         Hematologic    Bleeding problems:    Problems with blood clotting too easily:        Skin    Rashes or ulcers:        Constitutional    Fever or chills:     PHYSICAL EXAM:   Vitals:   11/14/20 0930  BP: (!) 150/90  Pulse: 66  Resp: 20  Temp: 98.1  F (36.7 C)  SpO2: 99%  Weight: 177 lb 12.8 oz (80.6  kg)  Height: '5\' 6"'$  (1.676 m)    GENERAL: The patient is a well-nourished female, in no acute distress. The vital signs are documented above. CARDIAC: There is a regular rate and rhythm.  VASCULAR: She has a palpable radial pulse bilaterally. PULMONARY: There is good air exchange bilaterally without wheezing or rales. MUSCULOSKELETAL: There are no major deformities or cyanosis. NEUROLOGIC: No focal weakness or paresthesias are detected. SKIN: There are no ulcers or rashes noted. PSYCHIATRIC: The patient has a normal affect.  DATA:    UPPER EXTREMITY VEIN MAP:  On the right side the forearm cephalic vein is very small.  The upper arm cephalic vein is marginal in size.  The basilic vein is marginal in size.  On the left side the forearm and upper arm cephalic vein are small.  The basilic vein is small.  UPPER EXTREMITY ARTERIAL DUPLEX:  On the right side there is a triphasic radial and ulnar waveform.  The brachial artery measures 0.45 cm in diameter.  On the left side, there is a triphasic radial and ulnar waveform the brachial artery measures 0.43 cm in diameter.  Deitra Mayo [facility] [phone]

## 2020-11-14 NOTE — H&P (View-Only) (Signed)
REASON FOR VISIT:   To evaluate for an AV fistula.  The consult is requested by Dr. Corliss Parish  MEDICAL ISSUES:   END-STAGE RENAL DISEASE: I think her best chance for a fistula would be a right brachiocephalic fistula.  If this is not possible she would likely require placement of an AV graft.  I have again discussed this with her. I have explained the indications for placement of an AV fistula or AV graft. I've explained that if at all possible we will place an AV fistula.  I have reviewed the risks of placement of an AV fistula including but not limited to: failure of the fistula to mature, need for subsequent interventions, and thrombosis. In addition I have reviewed the potential complications of placement of an AV graft. These risks include, but are not limited to, graft thrombosis, graft infection, wound healing problems, bleeding, arm swelling, and steal syndrome. All the patient's questions were answered and they are agreeable to proceed with surgery.  Her surgery is scheduled for 11/20/2020 which is a nondialysis day.  She dialyzes on Monday Wednesdays and Fridays.  HPI:   Meredith Reed is a pleasant 55 y.o. female who I saw in consultation on 08/08/2020 for hemodialysis access.  We were asked to place a fistula and if a fistula were not possible we were asked to proceed with placement of an AV graft.  She had a catheter.  The catheter is in place for about 2 months and was in the right IJ.  She did not have any uremic symptoms.  She was also being considered for peritoneal dialysis.  She had some concerns as to why she would require access placement in her arm.  I explained that this is typically as a backup plan if she got peritonitis.  Based on her vein map I thought her only real option for a fistula would be a right brachiocephalic AV fistula if this were not possible she would require an AV graft.  Given that she had a catheter on the right side she was at slightly higher risk  for arm swelling on the right.  She dialyzed on Monday Wednesdays and Fridays.  I believe she has had some problems with infection with her peritoneal dialysis catheter no longer wants to do peritoneal dialysis.  She sent back for placement of a fistula.  She denies any recent uremic symptoms.  Past Medical History:  Diagnosis Date  . Arthritis    knees  . Breast density   . Chronic kidney disease   . Hypertension     Family History  Problem Relation Age of Onset  . Emphysema Father   . Hypertension Sister        x2  . Deep vein thrombosis Sister   . Stroke Sister   . Deep vein thrombosis Paternal Grandmother   . Diabetes Paternal Grandmother   . Cancer Paternal Grandfather        ? type  . Diabetes Paternal Grandfather   . Diabetes Mother   . Diabetes Maternal Aunt   . Diabetes Maternal Uncle   . Diabetes Maternal Uncle     SOCIAL HISTORY: Social History   Tobacco Use  . Smoking status: Never Smoker  . Smokeless tobacco: Never Used  Substance Use Topics  . Alcohol use: No    Allergies  Allergen Reactions  . Lisinopril Cough    Current Outpatient Medications  Medication Sig Dispense Refill  . acetaminophen (TYLENOL) 500 MG tablet  Take 500-1,000 mg by mouth every 6 (six) hours as needed for mild pain (or headaches).    . ALPRAZolam (XANAX) 0.25 MG tablet Take 0.25 mg by mouth daily as needed for anxiety.    Marland Kitchen amLODipine (NORVASC) 10 MG tablet Take 10 mg by mouth daily.    . B Complex-C-Folic Acid (RENA-VITE RX) 1 MG TABS Take 1 tablet by mouth in the morning.    Marland Kitchen buPROPion (WELLBUTRIN XL) 300 MG 24 hr tablet Take 300 mg by mouth in the morning.    . cholecalciferol (VITAMIN D) 1000 UNITS tablet Take 1,000 Units by mouth daily.    . DULoxetine (CYMBALTA) 60 MG capsule Take 60 mg by mouth in the morning and at bedtime.    . Homeopathic Products ([place]) LIQD Apply 1 application topically See admin instructions. Apply to bilateral legs at bedtime    .  labetalol (NORMODYNE) 200 MG tablet Take 200 mg by mouth every 12 (twelve) hours.    Marland Kitchen losartan (COZAAR) 100 MG tablet Take 100 mg by mouth every evening.    . magnesium gluconate (MAGONATE) 500 MG tablet Take 500 mg by mouth daily as needed (night time leg cramps).     . Multiple Vitamins-Minerals (MULTIVITAMIN WITH MINERALS) tablet Take 1 tablet by mouth daily.    . sevelamer carbonate (RENVELA) 800 MG tablet Take 800-2,400 mg by mouth See admin instructions. Take 2,400 mg by mouth up to three times a day with meals and 800-1,600 mg with each snack    . vitamin C (ASCORBIC ACID) 500 MG tablet Take 500 mg by mouth daily.     No current facility-administered medications for this visit.    REVIEW OF SYSTEMS:  '[X]'$  denotes positive finding, '[ ]'$  denotes negative finding Cardiac  Comments:  Chest pain or chest pressure:    Shortness of breath upon exertion:    Short of breath when lying flat: x   Irregular heart rhythm:        Vascular    Pain in calf, thigh, or hip brought on by ambulation:    Pain in feet at night that wakes you up from your sleep:     Blood clot in your veins:    Leg swelling:         Pulmonary    Oxygen at home:    Productive cough:     Wheezing:         Neurologic    Sudden weakness in arms or legs:     Sudden numbness in arms or legs:     Sudden onset of difficulty speaking or slurred speech:    Temporary loss of vision in one eye:     Problems with dizziness:         Gastrointestinal    Blood in stool:     Vomited blood:         Genitourinary    Burning when urinating:     Blood in urine:        Psychiatric    Major depression:         Hematologic    Bleeding problems:    Problems with blood clotting too easily:        Skin    Rashes or ulcers:        Constitutional    Fever or chills:     PHYSICAL EXAM:   Vitals:   11/14/20 0930  BP: (!) 150/90  Pulse: 66  Resp: 20  Temp: 98.1  F (36.7 C)  SpO2: 99%  Weight: 177 lb 12.8 oz (80.6  kg)  Height: '5\' 6"'$  (1.676 m)    GENERAL: The patient is a well-nourished female, in no acute distress. The vital signs are documented above. CARDIAC: There is a regular rate and rhythm.  VASCULAR: She has a palpable radial pulse bilaterally. PULMONARY: There is good air exchange bilaterally without wheezing or rales. MUSCULOSKELETAL: There are no major deformities or cyanosis. NEUROLOGIC: No focal weakness or paresthesias are detected. SKIN: There are no ulcers or rashes noted. PSYCHIATRIC: The patient has a normal affect.  DATA:    UPPER EXTREMITY VEIN MAP:  On the right side the forearm cephalic vein is very small.  The upper arm cephalic vein is marginal in size.  The basilic vein is marginal in size.  On the left side the forearm and upper arm cephalic vein are small.  The basilic vein is small.  UPPER EXTREMITY ARTERIAL DUPLEX:  On the right side there is a triphasic radial and ulnar waveform.  The brachial artery measures 0.45 cm in diameter.  On the left side, there is a triphasic radial and ulnar waveform the brachial artery measures 0.43 cm in diameter.  Deitra [facility] [phone]

## 2020-11-14 NOTE — Progress Notes (Deleted)
Primary Physician/Referring:  Maury Dus, MD  Patient ID: Meredith Reed, female    DOB: Sep 09, 1965, 55 y.o.   MRN: [id number]  No chief complaint on file.  HPI:    Meredith Reed  is a 55 y.o. 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  Denies history of family premature coronary artery disease, hyperlipidemia, diabetes mellitus, MI/TIA/CVA, tobacco use.  Patient is presently on kidney transplant list at [facility].  Patient was hospitalized 10/27/2020-10/31/2020 with worsening shortness of breath, orthopnea noted to be in new onset systolic heart failure with LVEF 25-30%.  Patient was diuresed with significant improvement of symptoms and inpatient stress test revealed no evidence of ischemia or infarction, consistent with nonischemic cardiomyopathy.  Recommend initiation of guideline directed medical therapy as tolerated for heart failure.  ***Patient now presents for follow up.   Past Medical History:  Diagnosis Date  . Arthritis    knees  . Breast density   . Chronic kidney disease   . Hypertension    Past Surgical History:  Procedure Laterality Date  . BREAST REDUCTION SURGERY  09/16/2011   Procedure: MAMMARY REDUCTION  (BREAST);  Surgeon: Charlene Brooke, MD;  Location: [place];  Service: Plastics;  Laterality: Bilateral;  . NO PAST SURGERIES     Family History  Problem Relation Age of Onset  . Emphysema Father   . Hypertension Sister        x2  . Deep vein thrombosis Sister   . Stroke Sister   . Deep vein thrombosis Paternal Grandmother   . Diabetes Paternal Grandmother   . Cancer Paternal Grandfather        ? type  . Diabetes Paternal Grandfather   . Diabetes Mother   . Diabetes Maternal Aunt   . Diabetes Maternal Uncle   . Diabetes Maternal Uncle     Social History   Tobacco Use  . Smoking status: Never Smoker  . Smokeless  tobacco: Never Used  Substance Use Topics  . Alcohol use: No   Marital Status: Married   ROS  ***ROS  Objective  Last menstrual period 07/28/2012.  Vitals with BMI 11/14/2020 10/31/2020 10/31/2020  Height '5\' 6"'$  - -  Weight 177 lbs 13 oz - 169 lbs 1 oz  BMI Q000111Q - Q000111Q  Systolic Q000111Q [id number]  Diastolic 90 80 68  Pulse 66 71 70      ***Physical Exam  Laboratory examination:   Recent Labs    03/16/20 1402 04/11/20 1358 10/29/20 0531 10/30/20 0126 10/31/20 0131  NA 131*   < > 135 132* 130*  K 3.5   < > 3.8 3.6 3.5  CL 91*   < > 99 95* 91*  CO2 25   < > '27 27 27  '$ GLUCOSE 97   < > 96 108* 110*  BUN 100*   < > 18 20 32*  CREATININE 10.79*   < > 5.13* 4.62* 7.16*  CALCIUM 9.1  8.9   < > 9.2 9.4 9.2  GFRNONAA 4*   < > 9* 11* 6*  GFRAA 4*  --   --   --   --    < > = values in this interval not displayed.   estimated creatinine clearance is 9.5 mL/min (A) (by C-G formula based on SCr of 7.16 mg/dL (H)).  CMP Latest Ref Rng & Units 10/31/2020 10/30/2020  10/29/2020  Glucose 70 - 99 mg/dL 110(H) 108(H) 96  BUN 6 - 20 mg/dL 32(H) 20 18  Creatinine 0.44 - 1.00 mg/dL 7.16(H) 4.62(H) 5.13(H)  Sodium 135 - 145 mmol/L 130(L) 132(L) 135  Potassium 3.5 - 5.1 mmol/L 3.5 3.6 3.8  Chloride 98 - 111 mmol/L 91(L) 95(L) 99  CO2 22 - 32 mmol/L '27 27 27  '$ Calcium 8.9 - 10.3 mg/dL 9.2 9.4 9.2  Total Protein 6.5 - 8.1 g/dL - - -  Total Bilirubin 0.3 - 1.2 mg/dL - - -  Alkaline Phos 38 - 126 U/L - - -  AST 15 - 41 U/L - - -  ALT 0 - 44 U/L - - -   CBC Latest Ref Rng & Units 10/30/2020 10/29/2020 10/28/2020  WBC 4.0 - 10.5 K/uL 6.2 5.6 7.2  Hemoglobin 12.0 - 15.0 g/dL 9.6(L) 9.0(L) 10.0(L)  Hematocrit 36.0 - 46.0 % 29.6(L) 27.9(L) 31.3(L)  Platelets 150 - 400 K/uL 257 215 200    Lipid Panel No results for input(s): CHOL, TRIG, LDLCALC, VLDL, HDL, CHOLHDL, LDLDIRECT in the last 8760 hours.  HEMOGLOBIN A1C No results found for: HGBA1C, MPG TSH Recent Labs    10/27/20 1726  TSH 1.941  BNP No  results found for: BNP  ProBNP No results found for: PROBNP  External labs:  None   Medications and allergies   Allergies  Allergen Reactions  . Lisinopril Cough     Outpatient Medications Prior to Visit  Medication Sig Dispense Refill  . acetaminophen (TYLENOL) 500 MG tablet Take 500-1,000 mg by mouth every 6 (six) hours as needed for mild pain (or headaches).    . ALPRAZolam (XANAX) 0.25 MG tablet Take 0.25 mg by mouth daily as needed for anxiety.    Marland Kitchen amLODipine (NORVASC) 10 MG tablet Take 10 mg by mouth daily.    . B Complex-C-Folic Acid (RENA-VITE RX) 1 MG TABS Take 1 tablet by mouth in the morning.    Marland Kitchen buPROPion (WELLBUTRIN XL) 300 MG 24 hr tablet Take 300 mg by mouth in the morning.    . cholecalciferol (VITAMIN D) 1000 UNITS tablet Take 1,000 Units by mouth daily.    . DULoxetine (CYMBALTA) 60 MG capsule Take 60 mg by mouth in the morning and at bedtime.    . Homeopathic Products ([place]) LIQD Apply 1 application topically See admin instructions. Apply to bilateral legs at bedtime    . labetalol (NORMODYNE) 200 MG tablet Take 200 mg by mouth every 12 (twelve) hours.    Marland Kitchen losartan (COZAAR) 100 MG tablet Take 100 mg by mouth every evening.    . magnesium gluconate (MAGONATE) 500 MG tablet Take 500 mg by mouth daily as needed (night time leg cramps).     . Multiple Vitamins-Minerals (MULTIVITAMIN WITH MINERALS) tablet Take 1 tablet by mouth daily.    . sevelamer carbonate (RENVELA) 800 MG tablet Take 800-2,400 mg by mouth See admin instructions. Take 2,400 mg by mouth up to three times a day with meals and 800-1,600 mg with each snack    . vitamin C (ASCORBIC ACID) 500 MG tablet Take 500 mg by mouth daily.     No facility-administered medications prior to visit.     Radiology:   No results found.  Chest x-ray 10/27/2020: 1. Increased perihilar densities bilaterally particularly in the left suprahilar region. Findings could represent central  vascular congestion or developing infection. Recommend follow-up imaging. 2. Left basilar chest densities. Findings may represent a combination of consolidation  and left pleural fluid. 3. Stable position of the dialysis catheter.  CT abdomen pelvis 10/27/2020: 1. Persistent bilateral pleural effusions that may have slightly enlarged since 10/21/2020. These pleural effusions are moderate in size, right side greater than left. 2. 3 mm nodule in the left lower lobe is indeterminate. No follow-up needed if patient is low-risk. Non-contrast chest CT can be considered in 12 months if patient is high-risk. This recommendation follows the consensus statement: Guidelines for Management of Incidental Pulmonary Nodules Detected on CT Images: From the Fleischner Society 2017; Radiology 2017; 284:228-243. 3. No acute abnormality in the abdomen or pelvis. 4. Again noted is fat stranding in the right upper abdomen possibly related an evolving or resolving omental infarct. 5. Stable appearance of the peritoneal dialysis catheter. Small amount of free fluid is the likely associated with the peritoneal dialysis catheter.  Cardiac Studies:   Echocardiogram 10/27/2020: 1. Left ventricular ejection fraction, by estimation, is 20 to 25%. The  left ventricle has severely decreased function. The left ventricle  demonstrates global hypokinesis. There is mild concentric left ventricular  hypertrophy. Left ventricular diastolic  parameters are consistent with Grade I diastolic dysfunction (impaired  relaxation).  2. Right ventricular systolic function is normal. The right ventricular  size is normal. Tricuspid regurgitation signal is inadequate for assessing  PA pressure.  3. Left atrial size was mild to moderately dilated.  4. A small pericardial effusion is present. The pericardial effusion is  circumferential. There is no evidence of cardiac tamponade.  5. The mitral valve is normal in structure.  Moderate mitral valve  regurgitation.  6. The aortic valve is normal in structure. Aortic valve regurgitation is  not visualized. No aortic stenosis is present.  7. The inferior vena cava is normal in size with <50% respiratory  variability, suggesting right atrial pressure of 8 mmHg.   EKG:   ***  10/27/2020: Sinus rhythm at a rate of 80 bpm.  Normal axis.  Poor refreshing, cannot exclude anteroseptal infarct old.  Lateral T wave inversions.  Compared to EKG 08/01/2020, lateral T wave inversions new.  Assessment  No diagnosis found.   There are no discontinued medications.  No orders of the defined types were placed in this encounter.   Recommendations:   ASTRAEA GALELLA is a 55 y.o. 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  Denies history of family premature coronary artery disease, hyperlipidemia, diabetes mellitus, MI/TIA/CVA, tobacco use.  Patient is presently on kidney [facility]  ***Patient now presents for follow up.   ***  Patient was seen in collaboration with Dr. Marland Kitchen He also reviewed patient's chart and examined the patient. Dr. Marland Kitchen is in agreement of the plan.   During this visit I reviewed and updated: Tobacco history  allergies medication reconciliation  medical history  surgical history  family history  social history.  This note was created using a voice recognition software as a result there may be grammatical errors inadvertently enclosed that do not reflect the nature of this encounter. Every attempt is made to correct such errors.   Alethia Berthold, PA-C 11/14/2020, 2:29 PM Office: [phone]

## 2020-11-15 ENCOUNTER — Ambulatory Visit: Payer: Self-pay | Admitting: Student

## 2020-11-15 DIAGNOSIS — I10 Essential (primary) hypertension: Secondary | ICD-10-CM

## 2020-11-15 DIAGNOSIS — I5022 Chronic systolic (congestive) heart failure: Secondary | ICD-10-CM

## 2020-11-15 DIAGNOSIS — N185 Chronic kidney disease, stage 5: Secondary | ICD-10-CM

## 2020-11-15 DIAGNOSIS — I428 Other cardiomyopathies: Secondary | ICD-10-CM

## 2020-11-19 ENCOUNTER — Encounter (HOSPITAL_COMMUNITY): Payer: Self-pay | Admitting: Vascular Surgery

## 2020-11-19 NOTE — Anesthesia Preprocedure Evaluation (Addendum)
Anesthesia Evaluation  Patient identified by MRN, date of birth, ID band Patient awake    Reviewed: Allergy & Precautions, NPO status , Patient's Chart, lab work & pertinent test results  Airway Mallampati: II  TM Distance: >3 FB Neck ROM: Full    Dental no notable dental hx.    Pulmonary neg pulmonary ROS,    Pulmonary exam normal breath sounds clear to auscultation       Cardiovascular hypertension, Pt. on medications and Pt. on home beta blockers +CHF  Normal cardiovascular exam Rhythm:Regular Rate:Normal  ECG: NSR, rate 88  ECHO: 1. Left ventricular ejection fraction, by estimation, is 20 to 25%. The left ventricle has severely decreased function. The left ventricle demonstrates global hypokinesis. There is mild concentric left ventricular hypertrophy. Left ventricular diastolic parameters are consistent with Grade I diastolic dysfunction (impaired relaxation). 2. Right ventricular systolic function is normal. The right ventricular size is normal. Tricuspid regurgitation signal is inadequate for assessing PA pressure. 3. Left atrial size was mild to moderately dilated. 4. A small pericardial effusion is present. The pericardial effusion is circumferential. There is no evidence of cardiac tamponade. 5. The mitral valve is normal in structure. Moderate mitral valve regurgitation. 6. The aortic valve is normal in structure. Aortic valve regurgitation is not visualized. No aortic stenosis is present. 7. The inferior vena cava is normal in size with <50% respiratory variability, suggesting right atrial pressure of 8 mmHg.   Nuclear stress 10/30/2020: IMPRESSION: 1. No reversible ischemia or infarction. 2. Global left ventricular hypokinesis and mild dilatation. 3. Left ventricular ejection fraction 44% 4. Non invasive risk stratification*: Intermediate   Neuro/Psych PSYCHIATRIC DISORDERS Anxiety Depression negative  neurological ROS     GI/Hepatic negative GI ROS, Neg liver ROS,   Endo/Other  negative endocrine ROS  Renal/GU ESRF and DialysisRenal diseaseOn HD M, W,F     Musculoskeletal  (+) Arthritis ,   Abdominal   Peds  Hematology negative hematology ROS (+)   Anesthesia Other Findings ESRD  Reproductive/Obstetrics                           Anesthesia Physical Anesthesia Plan  ASA: IV  Anesthesia Plan: Regional   Post-op Pain Management:    Induction:   PONV Risk Score and Plan: 2 and Ondansetron, Dexamethasone, Propofol infusion and Treatment may vary due to age or medical condition  Airway Management Planned: Simple Face Mask  Additional Equipment:   Intra-op Plan:   Post-operative Plan:   Informed Consent: I have reviewed the patients History and Physical, chart, labs and discussed the procedure including the risks, benefits and alternatives for the proposed anesthesia with the patient or authorized representative who has indicated his/her understanding and acceptance.     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  Regarding pleural effusions, she was seen for the same complaint at [facility]-4/25and found to have bilateral pleural effusions, right greater than left and had 1 L of straw colored fluid removed.  On admission at [facility], CT showed persistent bilateral pleural effusions that were slightly enlarged compared to pre--drainage scan from 4/24, right greater than left. She was seen by pulmonology and thoracentesis was not recommended.  She improved after receiving dialysis. Nephrology suspected Pt losing  weight and  lowered EDW further to prevent  Fluid re-accumulation.    During admission she was also found to have new onset systolic heart failure with significantly reduced LVEF 25 to 30%.  Troponins were mildly elevated but remained flat.  Findings were felt to be suggestive for nonischemic cardiomyopathy, however her EKG did also reveal new lateral T wave inversions.  Cardiology was consulted and nuclear stress test was ordered which showed no ischemia or infarction and stress LVEF looked somewhat better than echo.  Dr. Virgina Jock commented on result in note 10/30/2020 stating, "No ischemia/infarction on nuclear stress test. Stress LVEF, in factr, looks better than echo. 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 Looked fairly compensated today. Blood pressure soft since stress test. Will hold off starting GDMT for heart failure for now. Will arrange outpatient f/u"  Patient was previously dialyzing via peritoneal dialysis catheter.  She had issues with peritonitis and likely inefficient peritoneal lining.  She is now on hemodialysis via right TDC.  Patient will need day of surgery labs and evaluation.  EKG 10/27/2020: Normal sinus rhythm.  Rate 88. Anterior infarct , age undetermined. new lateral t wave inversions  Nuclear stress 10/30/2020: IMPRESSION: 1. No reversible ischemia or infarction.  2. Global left ventricular hypokinesis and mild dilatation.  3. Left ventricular ejection fraction 44%  4. Non invasive risk stratification*: Intermediate  Echocardiogram 10/27/2020: 1. Left ventricular ejection fraction, by estimation, is 20 to 25%. The  left ventricle has severely decreased function. The left ventricle  demonstrates global hypokinesis. There is mild concentric left ventricular  hypertrophy. Left ventricular diastolic  parameters are consistent with Grade I diastolic dysfunction (impaired  relaxation).  2. Right ventricular systolic function is normal. The right ventricular  size is normal. Tricuspid regurgitation signal is inadequate for assessing  PA  pressure.  3. Left atrial size was mild to moderately dilated.  4. A small pericardial effusion is present. The pericardial effusion is  circumferential. There is no evidence of cardiac tamponade.  5. The mitral valve is normal in structure. Moderate mitral valve  regurgitation.  6. The aortic valve is normal in structure. Aortic valve regurgitation is  not visualized. No aortic stenosis is present.  7. The inferior vena cava is normal in size with <50% respiratory  variability, suggesting right atrial pressure of 8 mmHg.   )      Anesthesia Quick Evaluation

## 2020-11-19 NOTE — Progress Notes (Addendum)
EKG: 10/29/20 CXR: 10/27/20 ECHO: 10/27/20 Stress Test: denies Cardiac Cath: denies  Fasting Blood Sugar- na Checks Blood Sugar_na__ times a day  OSA/CPAP: No  ASA/Blood Thinners: No  Covid test not needed  Anesthesia Review: Yes, abnormal recent echo  Patient denies shortness of breath, fever, cough, and chest pain at PAT appointment.  Patient verbalized understanding of instructions provided today at the PAT appointment.  Patient asked to review instructions at home and day of surgery.

## 2020-11-19 NOTE — Progress Notes (Signed)
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  Regarding pleural effusions, she was seen for the same complaint at [facility]from 4/[facility]-4/25and found to have bilateral pleural effusions, right greater than left and had 1 L of straw colored fluid removed.  On admission at [facility], CT showed persistent bilateral pleural effusions that were slightly enlarged compared to pre--drainage scan from 4/24, right greater than left. She was seen by pulmonology and thoracentesis was not recommended.  She improved after receiving dialysis. Nephrology suspected Pt losing weight and  lowered EDW further to prevent  Fluid re-accumulation.   During admission she was also found to have new onset systolic heart failure with significantly reduced LVEF 25 to 30%.  Troponins were mildly elevated but remained flat.  Findings were felt to be suggestive for nonischemic cardiomyopathy, however her EKG did also reveal new lateral T wave inversions.  Cardiology was consulted and nuclear stress test was ordered which showed no ischemia or infarction and stress LVEF looked somewhat better than echo.  Dr. Virgina Jock commented on result in note 10/30/2020 stating, "No ischemia/infarction on nuclear stress test. Stress LVEF, in factr, looks better than echo. 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 Looked fairly compensated today. Blood pressure soft since stress test. Will hold off starting GDMT for heart failure for now. Will arrange outpatient f/u"  Patient was previously dialyzing via peritoneal dialysis catheter.  She had issues with peritonitis and likely inefficient peritoneal lining.  She is now on hemodialysis via right TDC.  Patient will need day of surgery labs and evaluation.  EKG 10/27/2020: Normal sinus rhythm.  Rate 88. Anterior infarct , age undetermined. new  lateral t wave inversions  Nuclear stress 10/30/2020: IMPRESSION: 1. No reversible ischemia or infarction.  2. Global left ventricular hypokinesis and mild dilatation.  3. Left ventricular ejection fraction 44%  4. Non invasive risk stratification*: Intermediate  Echocardiogram 10/27/2020: 1. Left ventricular ejection fraction, by estimation, is 20 to 25%. The  left ventricle has severely decreased function. The left ventricle  demonstrates global hypokinesis. There is mild concentric left ventricular  hypertrophy. Left ventricular diastolic  parameters are consistent with Grade I diastolic dysfunction (impaired  relaxation).  2. Right ventricular systolic function is normal. The right ventricular  size is normal. Tricuspid regurgitation signal is inadequate for assessing  PA pressure.  3. Left atrial size was mild to moderately dilated.  4. A small pericardial effusion is present. The pericardial effusion is  circumferential. There is no evidence of cardiac tamponade.  5. The mitral valve is normal in structure. Moderate mitral valve  regurgitation.  6. The aortic valve is normal in structure. Aortic valve regurgitation is  not visualized. No aortic stenosis is present.  7. The inferior vena cava is normal in size with <50% respiratory  variability, suggesting right atrial pressure of 8 mmHg.    Wynonia Musty [facility] Short Stay Center/Anesthesiology Phone [phone] 11/19/2020 3:24 PM

## 2020-11-20 ENCOUNTER — Ambulatory Visit (HOSPITAL_COMMUNITY): Payer: 59 | Admitting: Physician Assistant

## 2020-11-20 ENCOUNTER — Encounter (HOSPITAL_COMMUNITY): Payer: Self-pay | Admitting: Vascular Surgery

## 2020-11-20 ENCOUNTER — Other Ambulatory Visit: Payer: Self-pay

## 2020-11-20 ENCOUNTER — Encounter (HOSPITAL_COMMUNITY)
Admission: RE | Disposition: A | Discharge status: Home / Self Care | Payer: Self-pay | Source: Home / Self Care | Attending: Vascular Surgery

## 2020-11-20 ENCOUNTER — Ambulatory Visit (HOSPITAL_COMMUNITY)
Admission: RE | Admit: 2020-11-20 | Discharge: 2020-11-20 | Disposition: A | Discharge status: Home / Self Care | Payer: 59 | Attending: Vascular Surgery | Admitting: Vascular Surgery

## 2020-11-20 DIAGNOSIS — I5020 Unspecified systolic (congestive) heart failure: Secondary | ICD-10-CM | POA: Diagnosis not present

## 2020-11-20 DIAGNOSIS — Z992 Dependence on renal dialysis: Secondary | ICD-10-CM | POA: Diagnosis not present

## 2020-11-20 DIAGNOSIS — Z888 Allergy status to other drugs, medicaments and biological substances status: Secondary | ICD-10-CM | POA: Insufficient documentation

## 2020-11-20 DIAGNOSIS — I313 Pericardial effusion (noninflammatory): Secondary | ICD-10-CM | POA: Insufficient documentation

## 2020-11-20 DIAGNOSIS — N185 Chronic kidney disease, stage 5: Secondary | ICD-10-CM

## 2020-11-20 DIAGNOSIS — Z79899 Other long term (current) drug therapy: Secondary | ICD-10-CM | POA: Insufficient documentation

## 2020-11-20 DIAGNOSIS — N186 End stage renal disease: Secondary | ICD-10-CM | POA: Diagnosis not present

## 2020-11-20 DIAGNOSIS — I132 Hypertensive heart and chronic kidney disease with heart failure and with stage 5 chronic kidney disease, or end stage renal disease: Secondary | ICD-10-CM | POA: Insufficient documentation

## 2020-11-20 DIAGNOSIS — I340 Nonrheumatic mitral (valve) insufficiency: Secondary | ICD-10-CM | POA: Diagnosis not present

## 2020-11-20 HISTORY — DX: Anxiety disorder, unspecified: F41.9

## 2020-11-20 HISTORY — DX: Depression, unspecified: F32.A

## 2020-11-20 HISTORY — PX: AV FISTULA PLACEMENT: SHX1204

## 2020-11-20 LAB — POCT I-STAT, CHEM 8
BUN: 23 mg/dL — ABNORMAL HIGH (ref 6–20)
Calcium, Ion: 1.12 mmol/L — ABNORMAL LOW (ref 1.15–1.40)
Chloride: 99 mmol/L (ref 98–111)
Creatinine, Ser: 6.6 mg/dL — ABNORMAL HIGH (ref 0.44–1.00)
Glucose, Bld: 86 mg/dL (ref 70–99)
HCT: 30 % — ABNORMAL LOW (ref 36.0–46.0)
Hemoglobin: 10.2 g/dL — ABNORMAL LOW (ref 12.0–15.0)
Potassium: 3.8 mmol/L (ref 3.5–5.1)
Sodium: 139 mmol/L (ref 135–145)
TCO2: 30 mmol/L (ref 22–32)

## 2020-11-20 SURGERY — ARTERIOVENOUS (AV) FISTULA CREATION
Anesthesia: Regional | Site: Arm Upper | Laterality: Right

## 2020-11-20 MED ORDER — LIDOCAINE-EPINEPHRINE 1 %-1:100000 IJ SOLN
INTRAMUSCULAR | Status: AC
  Filled 2020-11-20: qty 1

## 2020-11-20 MED ORDER — PROMETHAZINE HCL 25 MG/ML IJ SOLN: 6.2500 mg | INTRAMUSCULAR | Status: DC | PRN

## 2020-11-20 MED ORDER — PAPAVERINE HCL 30 MG/ML IJ SOLN
INTRAMUSCULAR | Status: AC
  Filled 2020-11-20: qty 2

## 2020-11-20 MED ORDER — PAPAVERINE HCL 30 MG/ML IJ SOLN
INTRAMUSCULAR | Status: DC | PRN
  Administered 2020-11-20: 60 mg via INTRAVENOUS

## 2020-11-20 MED ORDER — PROTAMINE SULFATE 10 MG/ML IV SOLN
INTRAVENOUS | Status: DC | PRN
  Administered 2020-11-20: 40 mg via INTRAVENOUS

## 2020-11-20 MED ORDER — FENTANYL CITRATE (PF) 100 MCG/2ML IJ SOLN: 25.0000 ug | INTRAMUSCULAR | Status: DC | PRN

## 2020-11-20 MED ORDER — MIDAZOLAM HCL 2 MG/2ML IJ SOLN
INTRAMUSCULAR | Status: AC
  Filled 2020-11-20: qty 2

## 2020-11-20 MED ORDER — ORAL CARE MOUTH RINSE: 15.0000 mL | Freq: Once | OROMUCOSAL | Status: AC

## 2020-11-20 MED ORDER — OXYCODONE HCL 5 MG PO TABS: 5.0000 mg | ORAL_TABLET | Freq: Once | ORAL | Status: DC | PRN

## 2020-11-20 MED ORDER — 0.9 % SODIUM CHLORIDE (POUR BTL) OPTIME
TOPICAL | Status: DC | PRN
  Administered 2020-11-20: 1000 mL

## 2020-11-20 MED ORDER — SODIUM CHLORIDE 0.9 % IV SOLN
INTRAVENOUS | Status: AC
  Filled 2020-11-20: qty 1.2

## 2020-11-20 MED ORDER — CHLORHEXIDINE GLUCONATE 0.12 % MT SOLN: 15.0000 mL | Freq: Once | OROMUCOSAL | Status: AC

## 2020-11-20 MED ORDER — GLYCOPYRROLATE PF 0.2 MG/ML IJ SOSY
PREFILLED_SYRINGE | INTRAMUSCULAR | Status: AC
  Filled 2020-11-20: qty 1

## 2020-11-20 MED ORDER — FENTANYL CITRATE (PF) 100 MCG/2ML IJ SOLN
INTRAMUSCULAR | Status: DC | PRN
  Administered 2020-11-20: 50 ug via INTRAVENOUS

## 2020-11-20 MED ORDER — CHLORHEXIDINE GLUCONATE 0.12 % MT SOLN
OROMUCOSAL | Status: AC
  Administered 2020-11-20: 15 mL via OROMUCOSAL
  Filled 2020-11-20: qty 15

## 2020-11-20 MED ORDER — FENTANYL CITRATE (PF) 250 MCG/5ML IJ SOLN
INTRAMUSCULAR | Status: AC
  Filled 2020-11-20: qty 5

## 2020-11-20 MED ORDER — LIDOCAINE-EPINEPHRINE 1 %-1:100000 IJ SOLN
INTRAMUSCULAR | Status: DC | PRN
  Administered 2020-11-20: 4 mL

## 2020-11-20 MED ORDER — SODIUM CHLORIDE 0.9 % IV SOLN: INTRAVENOUS | Status: DC

## 2020-11-20 MED ORDER — PHENYLEPHRINE 40 MCG/ML (10ML) SYRINGE FOR IV PUSH (FOR BLOOD PRESSURE SUPPORT)
PREFILLED_SYRINGE | INTRAVENOUS | Status: AC
  Filled 2020-11-20: qty 10

## 2020-11-20 MED ORDER — CHLORHEXIDINE GLUCONATE 4 % EX LIQD: 60.0000 mL | Freq: Once | CUTANEOUS | Status: DC

## 2020-11-20 MED ORDER — LIDOCAINE-EPINEPHRINE (PF) 1.5 %-1:200000 IJ SOLN
INTRAMUSCULAR | Status: DC | PRN
  Administered 2020-11-20: 30 mL via PERINEURAL

## 2020-11-20 MED ORDER — NEOSTIGMINE METHYLSULFATE 3 MG/3ML IV SOSY
PREFILLED_SYRINGE | INTRAVENOUS | Status: AC
  Filled 2020-11-20: qty 3

## 2020-11-20 MED ORDER — LIDOCAINE HCL (PF) 1 % IJ SOLN
INTRAMUSCULAR | Status: AC
  Filled 2020-11-20: qty 30

## 2020-11-20 MED ORDER — OXYCODONE-ACETAMINOPHEN 5-325 MG PO TABS: 1.0000 | ORAL_TABLET | Freq: Four times a day (QID) | ORAL | 0 refills | Status: DC | PRN

## 2020-11-20 MED ORDER — ONDANSETRON HCL 4 MG/2ML IJ SOLN
INTRAMUSCULAR | Status: AC
  Filled 2020-11-20: qty 2

## 2020-11-20 MED ORDER — HEPARIN SODIUM (PORCINE) 1000 UNIT/ML IJ SOLN
INTRAMUSCULAR | Status: DC | PRN
  Administered 2020-11-20: 7000 [IU] via INTRAVENOUS

## 2020-11-20 MED ORDER — PROPOFOL 500 MG/50ML IV EMUL
INTRAVENOUS | Status: DC | PRN
  Administered 2020-11-20: 50 ug/kg/min via INTRAVENOUS

## 2020-11-20 MED ORDER — CEFAZOLIN SODIUM-DEXTROSE 2-4 GM/100ML-% IV SOLN
2.0000 g | INTRAVENOUS | Status: AC
  Administered 2020-11-20: 2 g via INTRAVENOUS
  Filled 2020-11-20: qty 100

## 2020-11-20 MED ORDER — OXYCODONE HCL 5 MG/5ML PO SOLN: 5.0000 mg | Freq: Once | ORAL | Status: DC | PRN

## 2020-11-20 MED ORDER — ACETAMINOPHEN 10 MG/ML IV SOLN: 1000.0000 mg | Freq: Once | INTRAVENOUS | Status: DC | PRN

## 2020-11-20 MED ORDER — ONDANSETRON HCL 4 MG/2ML IJ SOLN
INTRAMUSCULAR | Status: DC | PRN
  Administered 2020-11-20: 4 mg via INTRAVENOUS

## 2020-11-20 MED ORDER — MIDAZOLAM HCL 5 MG/5ML IJ SOLN
INTRAMUSCULAR | Status: DC | PRN
  Administered 2020-11-20 (×2): 1 mg via INTRAVENOUS

## 2020-11-20 MED ORDER — PROPOFOL 10 MG/ML IV BOLUS
INTRAVENOUS | Status: AC
  Filled 2020-11-20: qty 20

## 2020-11-20 MED ORDER — SODIUM CHLORIDE 0.9 % IV SOLN
INTRAVENOUS | Status: DC | PRN
  Administered 2020-11-20: 500 mL

## 2020-11-20 SURGICAL SUPPLY — 35 items
ADH SKN CLS APL DERMABOND .7 (GAUZE/BANDAGES/DRESSINGS) ×1
ARMBAND PINK RESTRICT EXTREMIT (MISCELLANEOUS) ×3 IMPLANT
CANISTER SUCT 3000ML PPV (MISCELLANEOUS) ×2 IMPLANT
CANNULA VESSEL 3MM 2 BLNT TIP (CANNULA) ×3 IMPLANT
CLIP VESOCCLUDE MED 6/CT (CLIP) ×2 IMPLANT
CLIP VESOCCLUDE SM WIDE 6/CT (CLIP) ×3 IMPLANT
COVER PROBE W GEL 5X96 (DRAPES) IMPLANT
COVER WAND RF STERILE (DRAPES) ×1 IMPLANT
DECANTER SPIKE VIAL GLASS SM (MISCELLANEOUS) ×2 IMPLANT
DERMABOND ADVANCED (GAUZE/BANDAGES/DRESSINGS) ×1
DERMABOND ADVANCED .7 DNX12 (GAUZE/BANDAGES/DRESSINGS) ×1 IMPLANT
ELECT REM PT RETURN 9FT ADLT (ELECTROSURGICAL) ×2
ELECTRODE REM PT RTRN 9FT ADLT (ELECTROSURGICAL) ×1 IMPLANT
GLOVE BIO SURGEON STRL SZ7.5 (GLOVE) ×2 IMPLANT
GLOVE SRG 8 PF TXTR STRL LF DI (GLOVE) ×1 IMPLANT
GLOVE SURG UNDER POLY LF SZ8 (GLOVE) ×2
GOWN STRL REUS W/ TWL LRG LVL3 (GOWN DISPOSABLE) ×3 IMPLANT
GOWN STRL REUS W/TWL LRG LVL3 (GOWN DISPOSABLE) ×6
KIT BASIN OR (CUSTOM PROCEDURE TRAY) ×2 IMPLANT
KIT TURNOVER KIT B (KITS) ×2 IMPLANT
NDL HYPO 25GX1X1/2 BEV (NEEDLE) IMPLANT
NEEDLE HYPO 25GX1X1/2 BEV (NEEDLE) ×2 IMPLANT
NS IRRIG 1000ML POUR BTL (IV SOLUTION) ×2 IMPLANT
PACK CV ACCESS (CUSTOM PROCEDURE TRAY) ×2 IMPLANT
PAD ARMBOARD 7.5X6 YLW CONV (MISCELLANEOUS) ×4 IMPLANT
SPONGE SURGIFOAM ABS GEL 100 (HEMOSTASIS) IMPLANT
SUT MNCRL AB 4-0 PS2 18 (SUTURE) ×2 IMPLANT
SUT PROLENE 6 0 BV (SUTURE) ×2 IMPLANT
SUT VIC AB 3-0 SH 27 (SUTURE) ×2
SUT VIC AB 3-0 SH 27X BRD (SUTURE) ×1 IMPLANT
SYR 10ML LL (SYRINGE) ×1 IMPLANT
SYR 5ML LL (SYRINGE) ×1 IMPLANT
TOWEL GREEN STERILE (TOWEL DISPOSABLE) ×2 IMPLANT
UNDERPAD 30X36 HEAVY ABSORB (UNDERPADS AND DIAPERS) ×2 IMPLANT
WATER STERILE IRR 1000ML POUR (IV SOLUTION) ×2 IMPLANT

## 2020-11-20 NOTE — Transfer of Care (Signed)
Immediate Anesthesia Transfer of Care Note  Patient: RYE DORADO  Procedure(s) Performed: RIGHT RADIOCEPHALIC UPPER ARM ARTERIOVENOUS (AV) FISTULA CREATION (Right Arm Upper)  Patient Location: PACU  Anesthesia Type:MAC combined with regional for post-op pain  Level of Consciousness: awake and drowsy  Airway & Oxygen Therapy: Patient Spontanous Breathing  Post-op Assessment: Report given to RN and Post -op Vital signs reviewed and stable  Post vital signs: Reviewed and stable  Last Vitals:  Vitals Value Taken Time  BP 140/67 11/20/20 0859  Temp    Pulse 64 11/20/20 0900  Resp 19 11/20/20 0900  SpO2 100 % 11/20/20 0900  Vitals shown include unvalidated device data.  Last Pain:  Vitals:   11/20/20 0630  TempSrc:   PainSc: 0-No pain      Patients Stated Pain Goal: 3 (29/19/16 6060)  Complications: No complications documented.

## 2020-11-20 NOTE — Discharge Instructions (Signed)
   [facility]  Discharge Instructions  AV Fistula or Graft Surgery for Dialysis Access  Please refer to the following instructions for your post-procedure care. Your surgeon or physician assistant will discuss any changes with you.  Activity  You may drive the day following your surgery, if you are comfortable and no longer taking prescription pain medication. Resume full activity as the soreness in your incision resolves.  Bathing/Showering  You may shower after you go home. Keep your incision dry for 48 hours. Do not soak in a bathtub, hot tub, or swim until the incision heals completely. You may not shower if you have a hemodialysis catheter.  Incision Care  Clean your incision with mild soap and water after 48 hours. Pat the area dry with a clean towel. You do not need a bandage unless otherwise instructed. Do not apply any ointments or creams to your incision. You may have skin glue on your incision. Do not peel it off. It will come off on its own in about one week. Your arm may swell a bit after surgery. To reduce swelling use pillows to elevate your arm so it is above your heart. Your doctor will tell you if you need to lightly wrap your arm with an ACE bandage.  Diet  Resume your normal diet. There are not special food restrictions following this procedure. In order to heal from your surgery, it is CRITICAL to get adequate nutrition. Your body requires vitamins, minerals, and protein. Vegetables are the best source of vitamins and minerals. Vegetables also provide the perfect balance of protein. Processed food has little nutritional value, so try to avoid this.  Medications  Resume taking all of your medications. If your incision is causing pain, you may take over-the counter pain relievers such as acetaminophen (Tylenol). If you were prescribed a stronger pain medication, please be aware these medications can cause nausea and constipation. Prevent  nausea by taking the medication with a snack or meal. Avoid constipation by drinking plenty of fluids and eating foods with high amount of fiber, such as fruits, vegetables, and grains.  Do not take Tylenol if you are taking prescription pain medications.  Follow up Your surgeon may want to see you in the office following your access surgery. If so, this will be arranged at the time of your surgery.  Please call us immediately for any of the following conditions:  . Increased pain, redness, drainage (pus) from your incision site . Fever of 101 degrees or higher . Severe or worsening pain at your incision site . Hand pain or numbness. .  Reduce your risk of vascular disease:  . Stop smoking. If you would like help, call [facility] at 1-800-QUIT-NOW 661-579-9875) or [place] at 802-670-3336  . Manage your cholesterol . Maintain a desired weight . Control your diabetes . Keep your blood pressure down  Dialysis  It will take several weeks to several months for your new dialysis access to be ready for use. Your surgeon will determine when it is okay to use it. Your nephrologist will continue to direct your dialysis. You can continue to use your Permcath until your new access is ready for use.   11/20/2020 ANACANI WESS [id number] October 02, 1965  Surgeon(s): Angelia Mould, MD  Procedure(s): RIGHT RADIOCEPHALIC UPPER ARM ARTERIOVENOUS (AV) FISTULA CREATION  x Do not stick fistula for 12 weeks    If you have any questions, please call the office at [phone].

## 2020-11-20 NOTE — Interval H&P Note (Signed)
History and Physical Interval Note:  11/20/2020 7:01 AM  Meredith Reed Meredith Reed  has presented today for surgery, with the diagnosis of ESRD.  The various methods of treatment have been discussed with the patient and family. After consideration of risks, benefits and other options for treatment, the patient has consented to  Procedure(s): RIGHT ARTERIOVENOUS (AV) FISTULA CREATION VS. RIGHT ARTERIOVENOUS GRAFT (Right) as a surgical intervention.  The patient's history has been reviewed, patient examined, no change in status, stable for surgery.  I have reviewed the patient's chart and labs.  Questions were answered to the patient's satisfaction.     Deitra Mayo

## 2020-11-20 NOTE — Progress Notes (Signed)
While reviewing PTA meds patient states that she last took Labetalol yesterday at 9:30 pm. Frequency on med list shows patient taking med BID. Patient states she only takes med once a day at night. Dr. Cherene Altes made aware that patient last took Labetalol yesterday and Amlodipine this morning at 0430. No further orders received at this time.

## 2020-11-20 NOTE — Anesthesia Postprocedure Evaluation (Signed)
Anesthesia Post Note  Patient: Meredith Reed  Procedure(s) Performed: RIGHT RADIOCEPHALIC UPPER ARM ARTERIOVENOUS (AV) FISTULA CREATION (Right Arm Upper)     Patient location during evaluation: PACU Anesthesia Type: Regional Level of consciousness: awake Pain management: pain level controlled Vital Signs Assessment: post-procedure vital signs reviewed and stable Respiratory status: spontaneous breathing, nonlabored ventilation, respiratory function stable and patient connected to nasal cannula oxygen Cardiovascular status: stable and blood pressure returned to baseline Postop Assessment: no apparent nausea or vomiting Anesthetic complications: no   No complications documented.  Last Vitals:  Vitals:   11/20/20 0930 11/20/20 0945  BP: 135/72 137/69  Pulse: 62 69  Resp: 15 (!) 22  Temp:  36.6 C  SpO2: 94% 96%    Last Pain:  Vitals:   11/20/20 0945  TempSrc:   PainSc: 0-No pain                 Megean Fabio P Shyasia Funches

## 2020-11-20 NOTE — Anesthesia Procedure Notes (Signed)
Anesthesia Regional Block: Supraclavicular block   Pre-Anesthetic Checklist: ,, timeout performed, Correct Patient, Correct Site, Correct Laterality, Correct Procedure, Correct Position, site marked, Risks and benefits discussed,  Surgical consent,  Pre-op evaluation,  At surgeon's request and post-op pain management  Laterality: Right  Prep: chloraprep       Needles:  Injection technique: Single-shot  Needle Type: Echogenic Stimulator Needle     Needle Length: 9cm  Needle Gauge: 21     Additional Needles:   Procedures:,,,, ultrasound used (permanent image in chart),,,,  Narrative:  Start time: 11/20/2020 7:15 AM End time: 11/20/2020 7:25 AM Injection made incrementally with aspirations every 5 mL.  Performed by: Personally  Anesthesiologist: Murvin Natal, MD  Additional Notes: Functioning IV was confirmed and monitors were applied.  A timeout was performed. Sterile prep, hand hygiene and sterile gloves were used. A 71m 21ga Arrow echogenic stimulator needle was used. Negative aspiration and negative test dose prior to incremental administration of local anesthetic. The patient tolerated the procedure well.  Ultrasound guidance: relevent anatomy identified, needle position confirmed, local anesthetic spread visualized around nerve(s), vascular puncture avoided.  Image printed for medical record.

## 2020-11-20 NOTE — Op Note (Signed)
    NAME: Meredith Reed    MRN: [id number] DOB: [birth year]    DATE OF OPERATION: 11/20/2020  PREOP DIAGNOSIS:    End-stage renal disease  POSTOP DIAGNOSIS:    Same  PROCEDURE:    Right upper arm radiocephalic fistula  SURGEON: Judeth Cornfield. Scot Dock, MD  ASSIST: Liana Crocker, PA  ANESTHESIA: Block with local supplementation  EBL: Minimal  INDICATIONS:    Meredith Reed is a 55 y.o. female who has a functioning right IJ tunneled dialysis catheter.  She dialyzes on Monday Wednesdays and Fridays.  She presents for new access.  FINDINGS:   The patient has a high bifurcation of her brachial artery.  The cephalic vein was 3 mm.  There was a good thrill in the fistula at the completion of the procedure and a radial and ulnar signal with Doppler.  TECHNIQUE:   The patient was taken to the operating room after an axillary block was placed.  I looked at the cephalic vein in the upper arm myself with the SonoSite and I felt this was reasonable to attempt a fistula.  Right upper extremity was prepped and draped in usual sterile fashion.  Longitudinal incision was made above the antecubital level and here the cephalic vein was dissected free.  Branches were divided between clips and 3-0 silk ties.  Was ligated distally and irrigated up with heparinized saline.  Patient had a high bifurcation of the brachial artery.  The radial artery was dissected free beneath the fascia.  I thought this was a reasonable size artery.  The patient was heparinized.  The radial artery was clamped proximally and distally and a longitudinal arteriotomy was made.  The vein was spatulated and sewn end-to-side to the artery using continuous 6-0 Prolene suture.  At the completion was a good thrill in the fistula and a good radial and ulnar signal with the Doppler.  The heparin was partially reversed with protamine.  The wound was then closed with a deep layer of 3-0 Vicryl and the skin closed with 4-0 Vicryl.   Dermabond was applied.  The patient tolerated the procedure well was transferred to the recovery room in stable condition.  All needle and sponge counts were correct.  Given the complexity of the case a first assistant was necessary in order to expedient the procedure and safely perform the technical aspects of the operation.  Deitra Mayo, MD, FACS [facility]  DATE OF DICTATION:   11/20/2020

## 2020-11-21 ENCOUNTER — Encounter (HOSPITAL_COMMUNITY): Payer: Self-pay | Admitting: Vascular Surgery

## 2020-11-23 ENCOUNTER — Ambulatory Visit: Payer: Self-pay | Admitting: Student

## 2020-11-26 NOTE — Progress Notes (Signed)
Primary Physician/Referring:  Maury Dus, MD  Patient ID: Meredith Reed, female    DOB: [birth year], 55 y.o.   MRN: [id number]  Chief Complaint  Patient presents with  . Chronic Kidney Disease  . Hospitalization Follow-up  . New Patient (Initial Visit)   HPI:    Meredith Reed  is a 55 y.o. 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  Denies history of family premature coronary artery disease, hyperlipidemia, diabetes mellitus, MI/TIA/CVA, tobacco use.  Patient is presently on kidney transplant list at [facility].  Patient was hospitalized 10/27/2020-10/31/2020 with worsening shortness of breath, orthopnea noted to be in new onset systolic heart failure with LVEF 25-30%.  Patient was diuresed with significant improvement of symptoms and inpatient stress test revealed no evidence of ischemia or infarction, consistent with nonischemic cardiomyopathy.  Recommend initiation of guideline directed medical therapy as tolerated for heart failure.  Notably patient also underwent right AV fistula creation on 11/20/2020 by Dr. Deitra Mayo.  Patient now presents for follow up.  Patient reports since discharge she has been feeling relatively well without recurrence of shortness of breath orthopnea.  Denies chest pain, palpitations, dyspnea, dizziness, syncope, near syncope.  She reports she typically cooks at home, and has been doing her best to avoid high salt intake.  Since discharge patient's blood pressure has increased, and is now elevated.  Past Medical History:  Diagnosis Date  . Anxiety   . Breast density   . Chronic kidney disease   . Depression   . Hypertension    Past Surgical History:  Procedure Laterality Date  . AV FISTULA PLACEMENT Right 11/20/2020   Procedure: RIGHT RADIOCEPHALIC UPPER ARM ARTERIOVENOUS (AV) FISTULA CREATION;  Surgeon: Angelia Mould, MD;   Location: [place];  Service: Vascular;  Laterality: Right;  . BREAST REDUCTION SURGERY  09/16/2011   Procedure: MAMMARY REDUCTION  (BREAST);  Surgeon: Charlene Brooke, MD;  Location: [place];  Service: Plastics;  Laterality: Bilateral;  . NO PAST SURGERIES     Family History  Problem Relation Age of Onset  . Emphysema Father   . Hypertension Sister        x2  . Deep vein thrombosis Sister   . Stroke Sister   . Deep vein thrombosis Paternal Grandmother   . Diabetes Paternal Grandmother   . Cancer Paternal Grandfather        ? type  . Diabetes Paternal Grandfather   . Diabetes Mother   . Diabetes Maternal Aunt   . Diabetes Maternal Uncle   . Diabetes Maternal Uncle   . Asthma Brother     Social History   Tobacco Use  . Smoking status: Never Smoker  . Smokeless tobacco: Never Used  Substance Use Topics  . Alcohol use: No   Marital Status: Married   ROS  Review of Systems  Constitutional: Negative for malaise/fatigue and weight gain.  Cardiovascular: Negative for chest pain, claudication, leg swelling, near-syncope, orthopnea, palpitations, paroxysmal nocturnal dyspnea and syncope.  Respiratory: Negative for shortness of breath.   Hematologic/Lymphatic: Does not bruise/bleed easily.  Gastrointestinal: Negative for melena.  Neurological: Negative for dizziness and weakness.    Objective  Blood pressure (!) 162/84, pulse 70, temperature 97.6 F (36.4 C), temperature source Temporal, resp. rate 16, height '5\' 7"'$  (1.702 m), weight 181 lb (82.1 kg), last menstrual period 07/28/2012, SpO2 98 %.  Vitals  with BMI 11/27/2020 11/20/2020 11/20/2020  Height '5\' 7"'$  - -  Weight 181 lbs - -  BMI [id number] - -  Systolic 0000000 0000000 [id number]  Diastolic 84 69 72  Pulse 70 69 62      Physical Exam Vitals reviewed.  Constitutional:      Appearance: She is obese.     Comments: Right radiocephalic upper arm AV fistula  HENT:     Head: Normocephalic and atraumatic.   Cardiovascular:     Rate and Rhythm: Normal rate and regular rhythm.     Pulses: Intact distal pulses.     Heart sounds: S1 normal and S2 normal. Murmur heard.   Blowing systolic murmur is present with a grade of 2/6 at the apex. No gallop.   Pulmonary:     Effort: Pulmonary effort is normal. No respiratory distress.     Breath sounds: No wheezing, rhonchi or rales.  Musculoskeletal:     Right lower leg: No edema.     Left lower leg: No edema.  Neurological:     Mental Status: She is alert.     Laboratory examination:   Recent Labs    03/16/20 1402 04/11/20 1358 10/29/20 0531 10/30/20 0126 10/31/20 0131 11/20/20 0642  NA 131*   < > 135 132* 130* 139  K 3.5   < > 3.8 3.6 3.5 3.8  CL 91*   < > 99 95* 91* 99  CO2 25   < > '27 27 27  '$ --   GLUCOSE 97   < > 96 108* 110* 86  BUN 100*   < > 18 20 32* 23*  CREATININE 10.79*   < > 5.13* 4.62* 7.16* 6.60*  CALCIUM 9.1  8.9   < > 9.2 9.4 9.2  --   GFRNONAA 4*   < > 9* 11* 6*  --   GFRAA 4*  --   --   --   --   --    < > = values in this interval not displayed.   estimated creatinine clearance is 10.6 mL/min (A) (by C-G formula based on SCr of 6.6 mg/dL (H)).  CMP Latest Ref Rng & Units 11/20/2020 10/31/2020 10/30/2020  Glucose 70 - 99 mg/dL 86 110(H) 108(H)  BUN 6 - 20 mg/dL 23(H) 32(H) 20  Creatinine 0.44 - 1.00 mg/dL 6.60(H) 7.16(H) 4.62(H)  Sodium 135 - 145 mmol/L 139 130(L) 132(L)  Potassium 3.5 - 5.1 mmol/L 3.8 3.5 3.6  Chloride 98 - 111 mmol/L 99 91(L) 95(L)  CO2 22 - 32 mmol/L - 27 27  Calcium 8.9 - 10.3 mg/dL - 9.2 9.4  Total Protein 6.5 - 8.1 g/dL - - -  Total Bilirubin 0.3 - 1.2 mg/dL - - -  Alkaline Phos 38 - 126 U/L - - -  AST 15 - 41 U/L - - -  ALT 0 - 44 U/L - - -   CBC Latest Ref Rng & Units 11/20/2020 10/30/2020 10/29/2020  WBC 4.0 - 10.5 K/uL - 6.2 5.6  Hemoglobin 12.0 - 15.0 g/dL 10.2(L) 9.6(L) 9.0(L)  Hematocrit 36.0 - 46.0 % 30.0(L) 29.6(L) 27.9(L)  Platelets 150 - 400 K/uL - 257 215    Lipid Panel No  results for input(s): CHOL, TRIG, LDLCALC, VLDL, HDL, CHOLHDL, LDLDIRECT in the last 8760 hours.  HEMOGLOBIN A1C No results found for: HGBA1C, MPG TSH Recent Labs    10/27/20 1726  TSH 1.941  BNP No results found for: BNP  ProBNP No results found for:  PROBNP  External labs:  None   Medications and allergies   Allergies  Allergen Reactions  . Lisinopril Cough     Outpatient Medications Prior to Visit  Medication Sig Dispense Refill  . acetaminophen (TYLENOL) 500 MG tablet Take 500-1,000 mg by mouth every 6 (six) hours as needed for mild pain (or headaches).    . ALPRAZolam (XANAX) 0.25 MG tablet Take 0.25 mg by mouth daily as needed for anxiety.    Marland Kitchen amLODipine (NORVASC) 10 MG tablet Take 10 mg by mouth daily.    . B Complex-C-Folic Acid (RENA-VITE RX) 1 MG TABS Take 1 tablet by mouth in the morning.    Marland Kitchen buPROPion (WELLBUTRIN XL) 300 MG 24 hr tablet Take 300 mg by mouth in the morning.    . DULoxetine (CYMBALTA) 60 MG capsule Take 120 mg by mouth daily.    . Homeopathic Products ([place]) LIQD Apply 1 application topically See admin instructions. Apply to bilateral legs at bedtime    . labetalol (NORMODYNE) 200 MG tablet Take 200 mg by mouth daily. Patient states she takes med daily at night    . sevelamer carbonate (RENVELA) 800 MG tablet Take 800-2,400 mg by mouth See admin instructions. Take 2,400 mg by mouth up to three times a day with meals and 800-1,600 mg with each snack    . losartan (COZAAR) 100 MG tablet Take 100 mg by mouth every evening.    Marland Kitchen oxyCODONE-acetaminophen (PERCOCET) 5-325 MG tablet Take 1 tablet by mouth every 6 (six) hours as needed. 8 tablet 0   No facility-administered medications prior to visit.     Radiology:   No results found.  Chest x-ray 10/27/2020: 1. Increased perihilar densities bilaterally particularly in the left suprahilar region. Findings could represent central vascular congestion or developing infection. Recommend  follow-up imaging. 2. Left basilar chest densities. Findings may represent a combination of consolidation and left pleural fluid. 3. Stable position of the dialysis catheter.  CT abdomen pelvis 10/27/2020: 1. Persistent bilateral pleural effusions that may have slightly enlarged since 10/21/2020. These pleural effusions are moderate in size, right side greater than left. 2. 3 mm nodule in the left lower lobe is indeterminate. No follow-up needed if patient is low-risk. Non-contrast chest CT can be considered in 12 months if patient is high-risk. This recommendation follows the consensus statement: Guidelines for Management of Incidental Pulmonary Nodules Detected on CT Images: From the Fleischner Society 2017; Radiology 2017; 284:228-243. 3. No acute abnormality in the abdomen or pelvis. 4. Again noted is fat stranding in the right upper abdomen possibly related an evolving or resolving omental infarct. 5. Stable appearance of the peritoneal dialysis catheter. Small amount of free fluid is the likely associated with the peritoneal dialysis catheter.  Cardiac Studies:  Nuclear stress test 10/30/2020: 1. No reversible ischemia or infarction. 2. Global left ventricular hypokinesis and mild dilatation. 3. Left ventricular ejection fraction 44% 4. Non invasive risk stratification*: Intermediate  Echocardiogram 10/27/2020: 1. Left ventricular ejection fraction, by estimation, is 20 to 25%. The  left ventricle has severely decreased function. The left ventricle  demonstrates global hypokinesis. There is mild concentric left ventricular  hypertrophy. Left ventricular diastolic  parameters are consistent with Grade I diastolic dysfunction (impaired  relaxation).  2. Right ventricular systolic function is normal. The right ventricular  size is normal. Tricuspid regurgitation signal is inadequate for assessing  PA pressure.  3. Left atrial size was mild to moderately dilated.  4. A  small pericardial effusion  is present. The pericardial effusion is  circumferential. There is no evidence of cardiac tamponade.  5. The mitral valve is normal in structure. Moderate mitral valve  regurgitation.  6. The aortic valve is normal in structure. Aortic valve regurgitation is  not visualized. No aortic stenosis is present.  7. The inferior vena cava is normal in size with <50% respiratory  variability, suggesting right atrial pressure of 8 mmHg.   EKG:  11/27/2020: Sinus rhythm at a rate of 71 bpm.  Normal axis.  Poor R wave progression, cannot exclude anteroseptal infarct old.  Compared to EKG 10/27/2020, lateral T wave inversions less prominent.  10/27/2020: Sinus rhythm at a rate of 80 bpm.  Normal axis.  Poor refreshing, cannot exclude anteroseptal infarct old.  Lateral T wave inversions.  Compared to EKG 08/01/2020, lateral T wave inversions new.  Assessment     ICD-10-CM   1. HFrEF (heart failure with reduced ejection fraction) (HCC)  [id number] Basic Metabolic Panel (BMET)    Lipid Panel With LDL/HDL Ratio  2. Essential hypertension  I10 EKG [id number]    Basic Metabolic Panel (BMET)    Lipid Panel With LDL/HDL Ratio  3. CKD (chronic kidney disease) stage 5, GFR less than 15 ml/min (HCC)  N18.5   4. Family history of premature CAD  Z82.49   5. Mixed hyperlipidemia  E78.2      Medications Discontinued During This Encounter  Medication Reason  . oxyCODONE-acetaminophen (PERCOCET) 5-325 MG tablet Error  . losartan (COZAAR) 100 MG tablet Change in therapy    Meds ordered this encounter  Medications  . sacubitril-valsartan (ENTRESTO) 24-26 MG    Sig: Take 1 tablet by mouth 2 (two) times daily.    Dispense:  60 tablet    Refill:  3    Recommendations:   LAGUANA FERRALL is a 55 y.o. 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  Denies history of family premature coronary artery  disease, hyperlipidemia, diabetes mellitus, MI/TIA/CVA, tobacco use.  Patient is presently on kidney transplant list at [facility]  Patient now presents for follow up.  Reviewed and discussed with patient regarding results of echocardiogram and stress test during hospitalization, details above.  Echocardiogram revealed new onset systolic heart failure with LVEF 25-30%, nuclear stress test was low risk for ischemia.  Discussed with patient regarding pathophysiology of nonischemic cardiomyopathy.  Patient's questions and concerns were addressed.  Patient appears well compensated at this time, there are no clinical signs of acute heart failure. Underlying arrhythmia cannot be excluded as etiology of nonischemic cardiomyopathy, could consider loop implantation in the future.   During hospitalization initiation of guideline directed medical therapy was held due to hypotension.  However patient's blood pressure is elevated in the office today.  We will therefore discontinue losartan, allow for 3-day washout period, and start patient on Entresto 24/26 mg twice daily.  Patient verbalized understanding of washout period.  We will plan to obtain repeat BMP in 1 week, this may be done at dialysis center.  Patient's heart rate is presently relatively well controlled, however would prefer <70 bpm.  We will therefore consider switching patient from labetalol to metoprolol or carvedilol at follow-up visit.  Recommend continued medication titration and guideline directed medical therapy as tolerated.  Will also obtain lipid profile testing. Will continue to work closely with nephrology as we titrate heart failure medications. Counseled patient regarding diet and  lifestyle modifications including salt restriction, fluid restriction, and weight loss.  Follow up in 2 weeks, sooner if needed, for heart failure and medication titration.      Alethia Berthold, PA-C 11/27/2020, 1:55 PM Office:  [phone]

## 2020-11-27 ENCOUNTER — Other Ambulatory Visit: Payer: Self-pay

## 2020-11-27 ENCOUNTER — Ambulatory Visit: Payer: 59 | Admitting: Student

## 2020-11-27 ENCOUNTER — Encounter: Payer: Self-pay | Admitting: Student

## 2020-11-27 VITALS — BP 162/84 | HR 70 | Temp 97.6°F | Resp 16 | Ht 67.0 in | Wt 181.0 lb

## 2020-11-27 DIAGNOSIS — N185 Chronic kidney disease, stage 5: Secondary | ICD-10-CM

## 2020-11-27 DIAGNOSIS — Z8249 Family history of ischemic heart disease and other diseases of the circulatory system: Secondary | ICD-10-CM

## 2020-11-27 DIAGNOSIS — I10 Essential (primary) hypertension: Secondary | ICD-10-CM

## 2020-11-27 DIAGNOSIS — E782 Mixed hyperlipidemia: Secondary | ICD-10-CM

## 2020-11-27 DIAGNOSIS — I5020 Unspecified systolic (congestive) heart failure: Secondary | ICD-10-CM

## 2020-11-27 MED ORDER — ENTRESTO 24-26 MG PO TABS: 1.0000 | ORAL_TABLET | Freq: Two times a day (BID) | ORAL | 3 refills | Status: AC

## 2020-11-30 ENCOUNTER — Telehealth: Payer: Self-pay

## 2020-11-30 NOTE — Telephone Encounter (Signed)
Called patient to confirm, she is taking Entresto, NOT Losartan.

## 2020-11-30 NOTE — Telephone Encounter (Signed)
-----   Message from Alethia Berthold, [place] sent at 11/30/2020  9:01 AM EDT ----- Please call patient and confirm she is taking Entresto and not losartan. Losartan is not on her list but got notice from pharmacy.

## 2020-12-10 ENCOUNTER — Other Ambulatory Visit: Payer: Self-pay

## 2020-12-10 ENCOUNTER — Telehealth: Payer: Self-pay

## 2020-12-10 MED ORDER — AMLODIPINE BESYLATE 10 MG PO TABS: 10.0000 mg | ORAL_TABLET | Freq: Every day | ORAL | 3 refills | Status: AC

## 2020-12-10 NOTE — Progress Notes (Deleted)
Primary Physician/Referring:  Maury Dus, MD  Patient ID: [name], female    DOB: 07/05/1965, 55 y.o.   MRN: [id number]  No chief complaint on file.  HPI:    Meredith Reed  is a 55 y.o. 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  Denies history of family premature coronary artery disease, hyperlipidemia, diabetes mellitus, MI/TIA/CVA, tobacco use.  Patient is presently on kidney transplant list at [facility].  Patient was hospitalized 10/27/2020-10/31/2020 with worsening shortness of breath, orthopnea noted to be in new onset systolic heart failure with LVEF 25-30%.  Patient was diuresed with significant improvement of symptoms and inpatient stress test revealed no evidence of ischemia or infarction, consistent with nonischemic cardiomyopathy.    ***Patient presents for 2-week follow-up of heart failure and medication titration.  At last visit discontinued losartan and transitioned patient to Entresto 24/26 mg twice daily.  ***Repeat BMP, neph? Lipid panel? HR?   Recommend initiation of guideline directed medical therapy as tolerated for heart failure.  Notably patient also underwent right AV fistula creation on 11/20/2020 by Dr. Deitra Mayo.  Patient now presents for follow up.  Patient reports since discharge she has been feeling relatively well without recurrence of shortness of breath orthopnea.  Denies chest pain, palpitations, dyspnea, dizziness, syncope, near syncope.  She reports she typically cooks at home, and has been doing her best to avoid high salt intake.  Since discharge patient's blood pressure has increased, and is now elevated.  Past Medical History:  Diagnosis Date   Anxiety    Breast density    Chronic kidney disease    Depression    Hypertension    Past Surgical History:  Procedure Laterality Date   AV FISTULA PLACEMENT Right  11/20/2020   Procedure: RIGHT RADIOCEPHALIC UPPER ARM ARTERIOVENOUS (AV) FISTULA CREATION;  Surgeon: Angelia Mould, MD;  Location: [place];  Service: Vascular;  Laterality: Right;   BREAST REDUCTION SURGERY  09/16/2011   Procedure: MAMMARY REDUCTION  (BREAST);  Surgeon: Charlene Brooke, MD;  Location: [place];  Service: Plastics;  Laterality: Bilateral;   NO PAST SURGERIES     Family History  Problem Relation Age of Onset   Emphysema Father    Hypertension Sister        x2   Deep vein thrombosis Sister    Stroke Sister    Deep vein thrombosis Paternal Grandmother    Diabetes Paternal Grandmother    Cancer Paternal Grandfather        ? type   Diabetes Paternal Grandfather    Diabetes Mother    Diabetes Maternal Aunt    Diabetes Maternal Uncle    Diabetes Maternal Uncle    Asthma Brother     Social History   Tobacco Use   Smoking status: Never   Smokeless tobacco: Never  Substance Use Topics   Alcohol use: No   Marital Status: Married   ROS  Review of Systems  Constitutional: Negative for malaise/fatigue and weight gain.  Cardiovascular:  Negative for chest pain, claudication, leg swelling, near-syncope, orthopnea, palpitations, paroxysmal nocturnal dyspnea and syncope.  Respiratory:  Negative for shortness of breath.   Hematologic/Lymphatic: Does not bruise/bleed easily.  Gastrointestinal:  Negative for melena.  Neurological:  Negative for dizziness and weakness.   Objective  Last menstrual period 07/28/2012.  Vitals with BMI 11/27/2020 11/20/2020 11/20/2020  Height '5\' 7"'$  - -  Weight 181 lbs - -  BMI [id number] - -  Systolic 0000000 0000000 [id number]  Diastolic 84 69 72  Pulse 70 69 62      Physical Exam Vitals reviewed.  Constitutional:      Appearance: She is obese.     Comments: Right radiocephalic upper arm AV fistula  HENT:     Head: Normocephalic and atraumatic.  Cardiovascular:     Rate and Rhythm: Normal rate and regular rhythm.     Pulses:  Intact distal pulses.     Heart sounds: S1 normal and S2 normal. Murmur heard.  Blowing systolic murmur is present with a grade of 2/6 at the apex.    No gallop.  Pulmonary:     Effort: Pulmonary effort is normal. No respiratory distress.     Breath sounds: No wheezing, rhonchi or rales.  Musculoskeletal:     Right lower leg: No edema.     Left lower leg: No edema.  Neurological:     Mental Status: She is alert.    Laboratory examination:   Recent Labs    03/16/20 1402 04/11/20 1358 10/29/20 0531 10/30/20 0126 10/31/20 0131 11/20/20 0642  NA 131*   < > 135 132* 130* 139  K 3.5   < > 3.8 3.6 3.5 3.8  CL 91*   < > 99 95* 91* 99  CO2 25   < > '27 27 27  '$ --   GLUCOSE 97   < > 96 108* 110* 86  BUN 100*   < > 18 20 32* 23*  CREATININE 10.79*   < > 5.13* 4.62* 7.16* 6.60*  CALCIUM 9.1  8.9   < > 9.2 9.4 9.2  --   GFRNONAA 4*   < > 9* 11* 6*  --   GFRAA 4*  --   --   --   --   --    < > = values in this interval not displayed.    estimated creatinine clearance is 10.6 mL/min (A) (by C-G formula based on SCr of 6.6 mg/dL (H)).  CMP Latest Ref Rng & Units 11/20/2020 10/31/2020 10/30/2020  Glucose 70 - 99 mg/dL 86 110(H) 108(H)  BUN 6 - 20 mg/dL 23(H) 32(H) 20  Creatinine 0.44 - 1.00 mg/dL 6.60(H) 7.16(H) 4.62(H)  Sodium 135 - 145 mmol/L 139 130(L) 132(L)  Potassium 3.5 - 5.1 mmol/L 3.8 3.5 3.6  Chloride 98 - 111 mmol/L 99 91(L) 95(L)  CO2 22 - 32 mmol/L - 27 27  Calcium 8.9 - 10.3 mg/dL - 9.2 9.4  Total Protein 6.5 - 8.1 g/dL - - -  Total Bilirubin 0.3 - 1.2 mg/dL - - -  Alkaline Phos 38 - 126 U/L - - -  AST 15 - 41 U/L - - -  ALT 0 - 44 U/L - - -   CBC Latest Ref Rng & Units 11/20/2020 10/30/2020 10/29/2020  WBC 4.0 - 10.5 K/uL - 6.2 5.6  Hemoglobin 12.0 - 15.0 g/dL 10.2(L) 9.6(L) 9.0(L)  Hematocrit 36.0 - 46.0 % 30.0(L) 29.6(L) 27.9(L)  Platelets 150 - 400 K/uL - 257 215    Lipid Panel No results for input(s): CHOL, TRIG, LDLCALC, VLDL, HDL, CHOLHDL, LDLDIRECT in the last  8760 hours.  HEMOGLOBIN A1C No results found for: HGBA1C, MPG TSH Recent Labs    10/27/20 1726  TSH 1.941   BNP No results found for: BNP  ProBNP No results found for: PROBNP  External labs:  None   Medications and  allergies   Allergies  Allergen Reactions   Lisinopril Cough     Outpatient Medications Prior to Visit  Medication Sig Dispense Refill   acetaminophen (TYLENOL) 500 MG tablet Take 500-1,000 mg by mouth every 6 (six) hours as needed for mild pain (or headaches).     ALPRAZolam (XANAX) 0.25 MG tablet Take 0.25 mg by mouth daily as needed for anxiety.     amLODipine (NORVASC) 10 MG tablet Take 1 tablet (10 mg total) by mouth daily. 90 tablet 3   B Complex-C-Folic Acid (RENA-VITE RX) 1 MG TABS Take 1 tablet by mouth in the morning.     buPROPion (WELLBUTRIN XL) 300 MG 24 hr tablet Take 300 mg by mouth in the morning.     DULoxetine (CYMBALTA) 60 MG capsule Take 120 mg by mouth daily.     Homeopathic Products (THERAWORX RELIEF) LIQD Apply 1 application topically See admin instructions. Apply to bilateral legs at bedtime     labetalol (NORMODYNE) 200 MG tablet Take 200 mg by mouth daily. Patient states she takes med daily at night     sacubitril-valsartan (ENTRESTO) 24-26 MG Take 1 tablet by mouth 2 (two) times daily. 60 tablet 3   sevelamer carbonate (RENVELA) 800 MG tablet Take 800-2,400 mg by mouth See admin instructions. Take 2,400 mg by mouth up to three times a day with meals and 800-1,600 mg with each snack     No facility-administered medications prior to visit.     Radiology:   No results found.  Chest x-ray 10/27/2020: 1. Increased perihilar densities bilaterally particularly in the left suprahilar region. Findings could represent central vascular congestion or developing infection. Recommend follow-up imaging. 2. Left basilar chest densities. Findings may represent a combination of consolidation and left pleural fluid. 3. Stable position of the  dialysis catheter.   CT abdomen pelvis 10/27/2020: 1. Persistent bilateral pleural effusions that may have slightly enlarged since 10/21/2020. These pleural effusions are moderate in size, right side greater than left. 2. 3 mm nodule in the left lower lobe is indeterminate. No follow-up needed if patient is low-risk. Non-contrast chest CT can be considered in 12 months if patient is high-risk. This recommendation follows the consensus statement: Guidelines for Management of Incidental Pulmonary Nodules Detected on CT Images: From the Fleischner Society 2017; Radiology 2017; 284:228-243. 3. No acute abnormality in the abdomen or pelvis. 4. Again noted is fat stranding in the right upper abdomen possibly related an evolving or resolving omental infarct. 5. Stable appearance of the peritoneal dialysis catheter. Small amount of free fluid is the likely associated with the peritoneal dialysis catheter.  Cardiac Studies:  Nuclear stress test 10/30/2020: 1. No reversible ischemia or infarction.  2. Global left ventricular hypokinesis and mild dilatation.  3. Left ventricular ejection fraction 44%  4. Non invasive risk stratification*: Intermediate  Echocardiogram 10/27/2020: 1. Left ventricular ejection fraction, by estimation, is 20 to 25%. The  left ventricle has severely decreased function. The left ventricle  demonstrates global hypokinesis. There is mild concentric left ventricular  hypertrophy. Left ventricular diastolic   parameters are consistent with Grade I diastolic dysfunction (impaired  relaxation).   2. Right ventricular systolic function is normal. The right ventricular  size is normal. Tricuspid regurgitation signal is inadequate for assessing  PA pressure.   3. Left atrial size was mild to moderately dilated.   4. A small pericardial effusion is present. The pericardial effusion is  circumferential. There is no evidence of cardiac tamponade.   5.  The mitral valve is  normal in structure. Moderate mitral valve  regurgitation.   6. The aortic valve is normal in structure. Aortic valve regurgitation is  not visualized. No aortic stenosis is present.   7. The inferior vena cava is normal in size with <50% respiratory  variability, suggesting right atrial pressure of 8 mmHg.   EKG:  11/27/2020: Sinus rhythm at a rate of 71 bpm.  Normal axis.  Poor R wave progression, cannot exclude anteroseptal infarct old.  Compared to EKG 10/27/2020, lateral T wave inversions less prominent.  10/27/2020: Sinus rhythm at a rate of 80 bpm.  Normal axis.  Poor refreshing, cannot exclude anteroseptal infarct old.  Lateral T wave inversions.  Compared to EKG 08/01/2020, lateral T wave inversions new.  Assessment   No diagnosis found.    There are no discontinued medications.   No orders of the defined types were placed in this encounter.   Recommendations:   Meredith Reed is a 55 y.o. 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  Denies history of family premature coronary artery disease, hyperlipidemia, diabetes mellitus, MI/TIA/CVA, tobacco use.  Patient is presently on kidney transplant list at [facility].  Patient was hospitalized 10/27/2020-10/31/2020 with worsening shortness of breath, orthopnea noted to be in new onset systolic heart failure with LVEF 25-30%.  Patient was diuresed with significant improvement of symptoms and inpatient stress test revealed no evidence of ischemia or infarction, consistent with nonischemic cardiomyopathy.    ***Patient presents for 2-week follow-up of heart failure and medication titration.  At last visit discontinued losartan and transitioned patient to Entresto 24/26 mg twice daily.  ***Repeat BMP, neph? Lipid panel? HR?   ***  Patient now presents for follow up.  Reviewed and discussed with patient regarding results of  echocardiogram and stress test during hospitalization, details above.  Echocardiogram revealed new onset systolic heart failure with LVEF 25-30%, nuclear stress test was low risk for ischemia.  Discussed with patient regarding pathophysiology of nonischemic cardiomyopathy.  Patient's questions and concerns were addressed.  Patient appears well compensated at this time, there are no clinical signs of acute heart failure. Underlying arrhythmia cannot be excluded as etiology of nonischemic cardiomyopathy, could consider loop implantation in the future.   During hospitalization initiation of guideline directed medical therapy was held due to hypotension.  However patient's blood pressure is elevated in the office today.  We will therefore discontinue losartan, allow for 3-day washout period, and start patient on Entresto 24/26 mg twice daily.  Patient verbalized understanding of washout period.  We will plan to obtain repeat BMP in 1 week, this may be done at dialysis center.  Patient's heart rate is presently relatively well controlled, however would prefer <70 bpm.  We will therefore consider switching patient from labetalol to metoprolol or carvedilol at follow-up visit.  Recommend continued medication titration and guideline directed medical therapy as tolerated.  Will also obtain lipid profile testing. Will continue to work closely with nephrology as we titrate heart failure medications. Counseled patient regarding diet and lifestyle modifications including salt restriction, fluid restriction, and weight loss.  Follow up in 2 weeks, sooner if needed, for heart failure and medication titration.      Alethia Berthold, PA-C 12/10/2020, 3:49 PM Office: [phone]

## 2020-12-10 NOTE — Telephone Encounter (Signed)
Yes, you may refill amlodipine. And yes, would like to see her for medication titration tomorrow.

## 2020-12-10 NOTE — Telephone Encounter (Signed)
Ok thank you. I will refill now.

## 2020-12-10 NOTE — Telephone Encounter (Signed)
Patient called for additional refills on her Amlodipine. Patient was last seen on May 31st and is asking if she needs to keep appointment for tomorrow June 14th? Please advise.

## 2020-12-11 ENCOUNTER — Ambulatory Visit: Payer: 59 | Admitting: Student

## 2020-12-13 ENCOUNTER — Other Ambulatory Visit: Payer: Self-pay

## 2020-12-13 ENCOUNTER — Encounter: Payer: Self-pay | Admitting: Student

## 2020-12-13 ENCOUNTER — Ambulatory Visit: Payer: 59 | Admitting: Student

## 2020-12-13 VITALS — BP 116/72 | HR 69 | Temp 98.4°F | Ht 67.0 in | Wt 174.0 lb

## 2020-12-13 DIAGNOSIS — I10 Essential (primary) hypertension: Secondary | ICD-10-CM

## 2020-12-13 DIAGNOSIS — N185 Chronic kidney disease, stage 5: Secondary | ICD-10-CM

## 2020-12-13 DIAGNOSIS — I5020 Unspecified systolic (congestive) heart failure: Secondary | ICD-10-CM

## 2020-12-13 MED ORDER — METOPROLOL SUCCINATE ER 50 MG PO TB24
50.0000 mg | ORAL_TABLET | Freq: Every day | ORAL | 3 refills | Status: DC
Start: 2020-12-13 — End: 2022-01-23

## 2020-12-13 NOTE — Progress Notes (Signed)
Primary Physician/Referring:  Maury Dus, MD  Patient ID: Meredith Reed, female    DOB: [birth year], 55 y.o.   MRN: [id number]  Chief Complaint  Patient presents with   HFrEF   Follow-up   Hypertension   med titration    HPI:    Meredith Reed  is a 55 y.o. 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  Denies history of family premature coronary artery disease, hyperlipidemia, diabetes mellitus, MI/TIA/CVA, tobacco use.  Patient is presently on kidney transplant list at [facility].  Patient was hospitalized 10/27/2020-10/31/2020 with worsening shortness of breath, orthopnea noted to be in new onset systolic heart failure with LVEF 25-30%.  Patient was diuresed with significant improvement of symptoms and inpatient stress test revealed no evidence of ischemia or infarction, consistent with nonischemic cardiomyopathy.    Patient presents for 2-week follow-up of heart failure and medication titration.  At last visit discontinued losartan and transitioned patient to Entresto 24/26 mg twice daily.  Patient reports she has tolerated this transition without issue.  Patient is feeling well overall and states she has been able to increase her physical activity without issue.  Denies chest pain, palpitations, dyspnea, dizziness, syncope, near syncope.  Denies orthopnea, PND, leg swelling.  Past Medical History:  Diagnosis Date   Anxiety    Breast density    Chronic kidney disease    Depression    Hypertension    Past Surgical History:  Procedure Laterality Date   AV FISTULA PLACEMENT Right 11/20/2020   Procedure: RIGHT RADIOCEPHALIC UPPER ARM ARTERIOVENOUS (AV) FISTULA CREATION;  Surgeon: Angelia Mould, MD;  Location: [place];  Service: Vascular;  Laterality: Right;   BREAST REDUCTION SURGERY  09/16/2011   Procedure: MAMMARY REDUCTION  (BREAST);  Surgeon: Charlene Brooke, MD;  Location: [place];  Service: Plastics;  Laterality: Bilateral;   NO PAST SURGERIES     Family History  Problem Relation Age of Onset   Emphysema Father    Hypertension Sister        x2   Deep vein thrombosis Sister    Stroke Sister    Deep vein thrombosis Paternal Grandmother    Diabetes Paternal Grandmother    Cancer Paternal Grandfather        ? type   Diabetes Paternal Grandfather    Diabetes Mother    Diabetes Maternal Aunt    Diabetes Maternal Uncle    Diabetes Maternal Uncle    Asthma Brother     Social History   Tobacco Use   Smoking status: Never   Smokeless tobacco: Never  Substance Use Topics   Alcohol use: No   Marital Status: Married   ROS  Review of Systems  Cardiovascular:  Negative for chest pain, claudication, leg swelling, near-syncope, orthopnea, palpitations, paroxysmal nocturnal dyspnea and syncope.  Respiratory:  Negative for shortness of breath.   Gastrointestinal:  Negative for melena.  Neurological:  Negative for dizziness and weakness.   Objective  Blood pressure 116/72, pulse 69, temperature 98.4 F (36.9 C), height '5\' 7"'$  (1.702 m), weight 174 lb (78.9 kg), last menstrual period 07/28/2012, SpO2 98 %.  Vitals with BMI 12/13/2020 11/27/2020 11/20/2020  Height '5\' 7"'$  '5\' 7"'$  -  Weight 174 lbs 181 lbs -  BMI [id number] -  Systolic [id number]  Diastolic 72 84 69  Pulse 69 70 69  Physical Exam Vitals reviewed.  Constitutional:      Appearance: She is obese.     Comments: Right radiocephalic upper arm AV fistula  HENT:     Head: Normocephalic and atraumatic.  Cardiovascular:     Rate and Rhythm: Normal rate and regular rhythm.     Pulses: Intact distal pulses.     Heart sounds: S1 normal and S2 normal. Murmur heard.  Blowing systolic murmur is present with a grade of 2/6 at the apex.    No gallop.  Pulmonary:     Effort: Pulmonary effort is normal. No respiratory distress.     Breath sounds: No  wheezing, rhonchi or rales.  Musculoskeletal:     Right lower leg: No edema.     Left lower leg: No edema.  Neurological:     Mental Status: She is alert.    Laboratory examination:   Recent Labs    03/16/20 1402 04/11/20 1358 10/29/20 0531 10/30/20 0126 10/31/20 0131 11/20/20 0642  NA 131*   < > 135 132* 130* 139  K 3.5   < > 3.8 3.6 3.5 3.8  CL 91*   < > 99 95* 91* 99  CO2 25   < > '27 27 27  '$ --   GLUCOSE 97   < > 96 108* 110* 86  BUN 100*   < > 18 20 32* 23*  CREATININE 10.79*   < > 5.13* 4.62* 7.16* 6.60*  CALCIUM 9.1  8.9   < > 9.2 9.4 9.2  --   GFRNONAA 4*   < > 9* 11* 6*  --   GFRAA 4*  --   --   --   --   --    < > = values in this interval not displayed.   CrCl cannot be calculated (Patient's most recent lab result is older than the maximum 21 days allowed.).  CMP Latest Ref Rng & Units 11/20/2020 10/31/2020 10/30/2020  Glucose 70 - 99 mg/dL 86 110(H) 108(H)  BUN 6 - 20 mg/dL 23(H) 32(H) 20  Creatinine 0.44 - 1.00 mg/dL 6.60(H) 7.16(H) 4.62(H)  Sodium 135 - 145 mmol/L 139 130(L) 132(L)  Potassium 3.5 - 5.1 mmol/L 3.8 3.5 3.6  Chloride 98 - 111 mmol/L 99 91(L) 95(L)  CO2 22 - 32 mmol/L - 27 27  Calcium 8.9 - 10.3 mg/dL - 9.2 9.4  Total Protein 6.5 - 8.1 g/dL - - -  Total Bilirubin 0.3 - 1.2 mg/dL - - -  Alkaline Phos 38 - 126 U/L - - -  AST 15 - 41 U/L - - -  ALT 0 - 44 U/L - - -   CBC Latest Ref Rng & Units 11/20/2020 10/30/2020 10/29/2020  WBC 4.0 - 10.5 K/uL - 6.2 5.6  Hemoglobin 12.0 - 15.0 g/dL 10.2(L) 9.6(L) 9.0(L)  Hematocrit 36.0 - 46.0 % 30.0(L) 29.6(L) 27.9(L)  Platelets 150 - 400 K/uL - 257 215    Lipid Panel No results for input(s): CHOL, TRIG, LDLCALC, VLDL, HDL, CHOLHDL, LDLDIRECT in the last 8760 hours.  HEMOGLOBIN A1C No results found for: HGBA1C, MPG TSH Recent Labs    10/27/20 1726  TSH 1.941  BNP No results found for: BNP  ProBNP No results found for: PROBNP  External labs:  None   Medications and allergies   Allergies   Allergen Reactions   Lisinopril Cough     Outpatient Medications Prior to Visit  Medication Sig Dispense Refill   acetaminophen (TYLENOL) 500 MG tablet  Take 500-1,000 mg by mouth every 6 (six) hours as needed for mild pain (or headaches).     ALPRAZolam (XANAX) 0.25 MG tablet Take 0.25 mg by mouth daily as needed for anxiety.     amLODipine (NORVASC) 10 MG tablet Take 1 tablet (10 mg total) by mouth daily. 90 tablet 3   B Complex-C-Folic Acid (RENA-VITE RX) 1 MG TABS Take 1 tablet by mouth in the morning.     buPROPion (WELLBUTRIN XL) 300 MG 24 hr tablet Take 300 mg by mouth in the morning.     DULoxetine (CYMBALTA) 60 MG capsule Take 120 mg by mouth daily.     Homeopathic Products (THERAWORX RELIEF) LIQD Apply 1 application topically See admin instructions. Apply to bilateral legs at bedtime     sacubitril-valsartan (ENTRESTO) 24-26 MG Take 1 tablet by mouth 2 (two) times daily. 60 tablet 3   sevelamer carbonate (RENVELA) 800 MG tablet Take 800-2,400 mg by mouth See admin instructions. Take 2,400 mg by mouth up to three times a day with meals and 800-1,600 mg with each snack     labetalol (NORMODYNE) 200 MG tablet Take 200 mg by mouth daily. Patient states she takes med daily at night     No facility-administered medications prior to visit.     Radiology:   No results found.  Chest x-ray 10/27/2020: 1. Increased perihilar densities bilaterally particularly in the left suprahilar region. Findings could represent central vascular congestion or developing infection. Recommend follow-up imaging. 2. Left basilar chest densities. Findings may represent a combination of consolidation and left pleural fluid. 3. Stable position of the dialysis catheter.   CT abdomen pelvis 10/27/2020: 1. Persistent bilateral pleural effusions that may have slightly enlarged since 10/21/2020. These pleural effusions are moderate in size, right side greater than left. 2. 3 mm nodule in the left lower lobe  is indeterminate. No follow-up needed if patient is low-risk. Non-contrast chest CT can be considered in 12 months if patient is high-risk. This recommendation follows the consensus statement: Guidelines for Management of Incidental Pulmonary Nodules Detected on CT Images: From the Fleischner Society 2017; Radiology 2017; 284:228-243. 3. No acute abnormality in the abdomen or pelvis. 4. Again noted is fat stranding in the right upper abdomen possibly related an evolving or resolving omental infarct. 5. Stable appearance of the peritoneal dialysis catheter. Small amount of free fluid is the likely associated with the peritoneal dialysis catheter.  Cardiac Studies:  Nuclear stress test 10/30/2020: 1. No reversible ischemia or infarction.  2. Global left ventricular hypokinesis and mild dilatation.  3. Left ventricular ejection fraction 44%  4. Non invasive risk stratification*: Intermediate  Echocardiogram 10/27/2020: 1. Left ventricular ejection fraction, by estimation, is 20 to 25%. The  left ventricle has severely decreased function. The left ventricle  demonstrates global hypokinesis. There is mild concentric left ventricular  hypertrophy. Left ventricular diastolic   parameters are consistent with Grade I diastolic dysfunction (impaired  relaxation).   2. Right ventricular systolic function is normal. The right ventricular  size is normal. Tricuspid regurgitation signal is inadequate for assessing  PA pressure.   3. Left atrial size was mild to moderately dilated.   4. A small pericardial effusion is present. The pericardial effusion is  circumferential. There is no evidence of cardiac tamponade.   5. The mitral valve is normal in structure. Moderate mitral valve  regurgitation.   6. The aortic valve is normal in structure. Aortic valve regurgitation is  not visualized. No aortic stenosis  is present.   7. The inferior vena cava is normal in size with <50% respiratory   variability, suggesting right atrial pressure of 8 mmHg.   EKG:  11/27/2020: Sinus rhythm at a rate of 71 bpm.  Normal axis.  Poor R wave progression, cannot exclude anteroseptal infarct old.  Compared to EKG 10/27/2020, lateral T wave inversions less prominent.  10/27/2020: Sinus rhythm at a rate of 80 bpm.  Normal axis.  Poor refreshing, cannot exclude anteroseptal infarct old.  Lateral T wave inversions.  Compared to EKG 08/01/2020, lateral T wave inversions new.  Assessment     ICD-10-CM   1. HFrEF (heart failure with reduced ejection fraction) (HCC)  I50.20     2. Essential hypertension  I10     3. CKD (chronic kidney disease) stage 5, GFR less than 15 ml/min (HCC)  N18.5        Medications Discontinued During This Encounter  Medication Reason   labetalol (NORMODYNE) 200 MG tablet Discontinued by provider    Meds ordered this encounter  Medications   metoprolol succinate (TOPROL-XL) 50 MG 24 hr tablet    Sig: Take 1 tablet (50 mg total) by mouth daily. Take with or immediately following a meal.    Dispense:  90 tablet    Refill:  3    Recommendations:   Meredith Reed is a 55 y.o. 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  Denies history of family premature coronary artery disease, hyperlipidemia, diabetes mellitus, MI/TIA/CVA, tobacco use.  Patient is presently on kidney transplant list at [facility].  Patient was hospitalized 10/27/2020-10/31/2020 with worsening shortness of breath, orthopnea noted to be in new onset systolic heart failure with LVEF 25-30%.  Patient was diuresed with significant improvement of symptoms and inpatient stress test revealed no evidence of ischemia or infarction, consistent with nonischemic cardiomyopathy.    Patient presents for 2-week follow-up of heart failure and medication titration.  At last visit discontinued losartan and  transitioned patient to Entresto 24/26 mg twice daily.  Patient is presently tolerating Entresto.  Discussed with patient regarding guideline directed medical therapy for heart failure including beta-blockers, SGLT2 inhibitors, and spironolactone.  Patient is hesitant to make multiple changes at once and will prefer to only make a single medication adjustment at today's office visit.  We will therefore discontinue labetalol and switch her to metoprolol succinate 50 mg once daily given that her blood pressure is soft at today's visit.  However have advised patient that if her blood pressure remains >130/80 mmHg on home readings and heart rate is >60 bpm she may increase metoprolol to 100 mg once daily, patient verbalized understanding agreement.  Would like to [employer] as well as add SGLT2 inhibitor in the future.  Could also consider addition of spironolactone as patient is on hemodialysis.  Will obtain repeat BMP as well as lipid profile testing.  Follow-up in 3 weeks, sooner if needed, for heart failure and medication titration.   Alethia Berthold, PA-C 12/13/2020, 10:30 AM Office: [phone]

## 2021-01-03 ENCOUNTER — Ambulatory Visit (INDEPENDENT_AMBULATORY_CARE_PROVIDER_SITE_OTHER): Payer: 59 | Admitting: Physician Assistant

## 2021-01-03 ENCOUNTER — Other Ambulatory Visit: Payer: Self-pay

## 2021-01-03 VITALS — BP 144/78 | HR 65 | Temp 98.1°F | Resp 20 | Ht 67.0 in | Wt 181.1 lb

## 2021-01-03 DIAGNOSIS — N186 End stage renal disease: Secondary | ICD-10-CM

## 2021-01-03 DIAGNOSIS — Z992 Dependence on renal dialysis: Secondary | ICD-10-CM

## 2021-01-03 NOTE — H&P (View-Only) (Signed)
    Postoperative Access Visit   History of Present Illness   Meredith Reed is a 55 y.o. year old female who presents for postoperative follow-up for right upper arm radiocephalic fistula by Dr. Scot Dock on 11/20/20. The patient's wounds are well healed.  The patient notes no steal symptoms.  The patient is able to complete their activities of daily living. She did have a PD catheter placed in March of this year but decided during her training that he was no longer interested in doing at home dialysis so this was removed in June.   She is currently dialyzing via right IJ TDC  on MWF at [facility] location  Physical Examination   Vitals:   01/03/21 1054  BP: (!) 144/78  Pulse: 65  Resp: 20  Temp: 98.1 F (36.7 C)  TempSrc: Temporal  SpO2: 100%  Weight: 181 lb 1.6 oz (82.1 kg)  Height: '5\' 7"'$  (1.702 m)   Body mass index is 28.36 kg/m.  right arm Incision is well healed, 2+ radial pulse, hand grip is 5/5, sensation in digits is intact, palpable thrill, bruit can  be auscultated. The fistula is palpable in the upper arm but it is deep    Medical Decision Making   Meredith Reed is a 55 y.o. year old female who presents s/p right upper arm radiocephalic fistula by Dr. Scot Dock on 11/20/20. Her incision is well healed. Her fistula has a great thrill but is deep in the upper arm clinically. Patent is without signs or symptoms of steal syndrome. She was not scheduled to have a duplex today because at the time she was anticipating doing peritoneal dialysis so she was just here today for an incision evaluation, but now she is no longer planning on doing PD. I will bring her back over the next couple weeks for a fistula duplex to evaluate to see if she will need a transposition. She will return with her fistula duplex and to be seen in the PA clinic to review her study.  Karoline Caldwell, PA-C [facility] Office: [phone]  Clinic MD: Scot Dock / Oneida Alar

## 2021-01-03 NOTE — Progress Notes (Deleted)
Primary Physician/Referring:  Maury Dus, MD  Patient ID: Meredith Reed, female    DOB: 08/28/1965, 55 y.o.   MRN: [id number]  No chief complaint on file.   HPI:    Meredith Reed  is a 55 y.o. 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  Denies history of family premature coronary artery disease, hyperlipidemia, diabetes mellitus, MI/TIA/CVA, tobacco use.  Patient is presently on kidney transplant list at [facility].  Patient was hospitalized 10/27/2020-10/31/2020 with worsening shortness of breath, orthopnea noted to be in new onset systolic heart failure with LVEF 25-30%.  Patient was diuresed with significant improvement of symptoms and inpatient stress test revealed no evidence of ischemia or infarction, consistent with nonischemic cardiomyopathy.    Patient presents for 3-week follow-up of heart failure and medication titration.  At last visit discontinue labetalol and switch her to metoprolol succinate 50 mg once daily.***  ***lipid panel and BMP not done?   Patient presents for 2-week follow-up of heart failure and medication titration.  At last visit discontinued losartan and transitioned patient to Entresto 24/26 mg twice daily.  Patient reports she has tolerated this transition without issue.  Patient is feeling well overall and states she has been able to increase her physical activity without issue.  Denies chest pain, palpitations, dyspnea, dizziness, syncope, near syncope.  Denies orthopnea, PND, leg swelling.  Past Medical History:  Diagnosis Date   Anxiety    Breast density    Chronic kidney disease    Depression    Hypertension    Past Surgical History:  Procedure Laterality Date   AV FISTULA PLACEMENT Right 11/20/2020   Procedure: RIGHT RADIOCEPHALIC UPPER ARM ARTERIOVENOUS (AV) FISTULA CREATION;  Surgeon: Angelia Mould, MD;  Location: [place];   Service: Vascular;  Laterality: Right;   BREAST REDUCTION SURGERY  09/16/2011   Procedure: MAMMARY REDUCTION  (BREAST);  Surgeon: Charlene Brooke, MD;  Location: [place];  Service: Plastics;  Laterality: Bilateral;   NO PAST SURGERIES     Family History  Problem Relation Age of Onset   Emphysema Father    Hypertension Sister        x2   Deep vein thrombosis Sister    Stroke Sister    Deep vein thrombosis Paternal Grandmother    Diabetes Paternal Grandmother    Cancer Paternal Grandfather        ? type   Diabetes Paternal Grandfather    Diabetes Mother    Diabetes Maternal Aunt    Diabetes Maternal Uncle    Diabetes Maternal Uncle    Asthma Brother     Social History   Tobacco Use   Smoking status: Never   Smokeless tobacco: Never  Substance Use Topics   Alcohol use: No   Marital Status: Married   ROS  Review of Systems  Cardiovascular:  Negative for chest pain, claudication, leg swelling, near-syncope, orthopnea, palpitations, paroxysmal nocturnal dyspnea and syncope.  Respiratory:  Negative for shortness of breath.   Gastrointestinal:  Negative for melena.  Neurological:  Negative for dizziness and weakness.   Objective  Last menstrual period 07/28/2012.  Vitals with BMI 01/03/2021 12/13/2020 11/27/2020  Height '5\' 7"'$  '5\' 7"'$  '5\' 7"'$   Weight 181 lbs 2 oz 174 lbs 181 lbs  BMI 28.36 [id number]  Systolic 123456 [id number]  Diastolic 78 72 84  Pulse 65 69 70  Physical Exam Vitals reviewed.  Constitutional:      Appearance: She is obese.     Comments: Right radiocephalic upper arm AV fistula  HENT:     Head: Normocephalic and atraumatic.  Cardiovascular:     Rate and Rhythm: Normal rate and regular rhythm.     Pulses: Intact distal pulses.     Heart sounds: S1 normal and S2 normal. Murmur heard.  Blowing systolic murmur is present with a grade of 2/6 at the apex.    No gallop.  Pulmonary:     Effort: Pulmonary effort is normal. No respiratory  distress.     Breath sounds: No wheezing, rhonchi or rales.  Musculoskeletal:     Right lower leg: No edema.     Left lower leg: No edema.  Neurological:     Mental Status: She is alert.    Laboratory examination:   Recent Labs    03/16/20 1402 04/11/20 1358 10/29/20 0531 10/30/20 0126 10/31/20 0131 11/20/20 0642  NA 131*   < > 135 132* 130* 139  K 3.5   < > 3.8 3.6 3.5 3.8  CL 91*   < > 99 95* 91* 99  CO2 25   < > '27 27 27  '$ --   GLUCOSE 97   < > 96 108* 110* 86  BUN 100*   < > 18 20 32* 23*  CREATININE 10.79*   < > 5.13* 4.62* 7.16* 6.60*  CALCIUM 9.1  8.9   < > 9.2 9.4 9.2  --   GFRNONAA 4*   < > 9* 11* 6*  --   GFRAA 4*  --   --   --   --   --    < > = values in this interval not displayed.    CrCl cannot be calculated (Patient's most recent lab result is older than the maximum 21 days allowed.).  CMP Latest Ref Rng & Units 11/20/2020 10/31/2020 10/30/2020  Glucose 70 - 99 mg/dL 86 110(H) 108(H)  BUN 6 - 20 mg/dL 23(H) 32(H) 20  Creatinine 0.44 - 1.00 mg/dL 6.60(H) 7.16(H) 4.62(H)  Sodium 135 - 145 mmol/L 139 130(L) 132(L)  Potassium 3.5 - 5.1 mmol/L 3.8 3.5 3.6  Chloride 98 - 111 mmol/L 99 91(L) 95(L)  CO2 22 - 32 mmol/L - 27 27  Calcium 8.9 - 10.3 mg/dL - 9.2 9.4  Total Protein 6.5 - 8.1 g/dL - - -  Total Bilirubin 0.3 - 1.2 mg/dL - - -  Alkaline Phos 38 - 126 U/L - - -  AST 15 - 41 U/L - - -  ALT 0 - 44 U/L - - -   CBC Latest Ref Rng & Units 11/20/2020 10/30/2020 10/29/2020  WBC 4.0 - 10.5 K/uL - 6.2 5.6  Hemoglobin 12.0 - 15.0 g/dL 10.2(L) 9.6(L) 9.0(L)  Hematocrit 36.0 - 46.0 % 30.0(L) 29.6(L) 27.9(L)  Platelets 150 - 400 K/uL - 257 215    Lipid Panel No results for input(s): CHOL, TRIG, LDLCALC, VLDL, HDL, CHOLHDL, LDLDIRECT in the last 8760 hours.  HEMOGLOBIN A1C No results found for: HGBA1C, MPG TSH Recent Labs    10/27/20 1726  TSH 1.941   BNP No results found for: BNP  ProBNP No results found for: PROBNP  External labs:  None    Medications and allergies   Allergies  Allergen Reactions   Lisinopril Cough     Outpatient Medications Prior to Visit  Medication Sig Dispense Refill   acetaminophen (TYLENOL) 500  MG tablet Take 500-1,000 mg by mouth every 6 (six) hours as needed for mild pain (or headaches).     ALPRAZolam (XANAX) 0.25 MG tablet Take 0.25 mg by mouth daily as needed for anxiety.     amLODipine (NORVASC) 10 MG tablet Take 1 tablet (10 mg total) by mouth daily. 90 tablet 3   B Complex-C-Folic Acid (RENA-VITE RX) 1 MG TABS Take 1 tablet by mouth in the morning.     buPROPion (WELLBUTRIN XL) 300 MG 24 hr tablet Take 300 mg by mouth in the morning.     DULoxetine (CYMBALTA) 60 MG capsule Take 120 mg by mouth daily.     Homeopathic Products (THERAWORX RELIEF) LIQD Apply 1 application topically See admin instructions. Apply to bilateral legs at bedtime     iron sucrose in sodium chloride 0.9 % 100 mL Iron Sucrose (Venofer)     metoprolol succinate (TOPROL-XL) 50 MG 24 hr tablet Take 1 tablet (50 mg total) by mouth daily. Take with or immediately following a meal. 90 tablet 3   sacubitril-valsartan (ENTRESTO) 24-26 MG Take 1 tablet by mouth 2 (two) times daily. 60 tablet 3   sevelamer carbonate (RENVELA) 800 MG tablet Take 800-2,400 mg by mouth See admin instructions. Take 2,400 mg by mouth up to three times a day with meals and 800-1,600 mg with each snack     No facility-administered medications prior to visit.     Radiology:   No results found.  Chest x-ray 10/27/2020: 1. Increased perihilar densities bilaterally particularly in the left suprahilar region. Findings could represent central vascular congestion or developing infection. Recommend follow-up imaging. 2. Left basilar chest densities. Findings may represent a combination of consolidation and left pleural fluid. 3. Stable position of the dialysis catheter.   CT abdomen pelvis 10/27/2020: 1. Persistent bilateral pleural effusions that may  have slightly enlarged since 10/21/2020. These pleural effusions are moderate in size, right side greater than left. 2. 3 mm nodule in the left lower lobe is indeterminate. No follow-up needed if patient is low-risk. Non-contrast chest CT can be considered in 12 months if patient is high-risk. This recommendation follows the consensus statement: Guidelines for Management of Incidental Pulmonary Nodules Detected on CT Images: From the Fleischner Society 2017; Radiology 2017; 284:228-243. 3. No acute abnormality in the abdomen or pelvis. 4. Again noted is fat stranding in the right upper abdomen possibly related an evolving or resolving omental infarct. 5. Stable appearance of the peritoneal dialysis catheter. Small amount of free fluid is the likely associated with the peritoneal dialysis catheter.  Cardiac Studies:  Nuclear stress test 10/30/2020: 1. No reversible ischemia or infarction.  2. Global left ventricular hypokinesis and mild dilatation.  3. Left ventricular ejection fraction 44%  4. Non invasive risk stratification*: Intermediate  Echocardiogram 10/27/2020: 1. Left ventricular ejection fraction, by estimation, is 20 to 25%. The  left ventricle has severely decreased function. The left ventricle  demonstrates global hypokinesis. There is mild concentric left ventricular  hypertrophy. Left ventricular diastolic   parameters are consistent with Grade I diastolic dysfunction (impaired  relaxation).   2. Right ventricular systolic function is normal. The right ventricular  size is normal. Tricuspid regurgitation signal is inadequate for assessing  PA pressure.   3. Left atrial size was mild to moderately dilated.   4. A small pericardial effusion is present. The pericardial effusion is  circumferential. There is no evidence of cardiac tamponade.   5. The mitral valve is normal in structure. Moderate  mitral valve  regurgitation.   6. The aortic valve is normal in structure.  Aortic valve regurgitation is  not visualized. No aortic stenosis is present.   7. The inferior vena cava is normal in size with <50% respiratory  variability, suggesting right atrial pressure of 8 mmHg.   EKG:  11/27/2020: Sinus rhythm at a rate of 71 bpm.  Normal axis.  Poor R wave progression, cannot exclude anteroseptal infarct old.  Compared to EKG 10/27/2020, lateral T wave inversions less prominent.  10/27/2020: Sinus rhythm at a rate of 80 bpm.  Normal axis.  Poor refreshing, cannot exclude anteroseptal infarct old.  Lateral T wave inversions.  Compared to EKG 08/01/2020, lateral T wave inversions new.  Assessment   No diagnosis found.    There are no discontinued medications.   No orders of the defined types were placed in this encounter.   Recommendations:   Meredith Reed is a 55 y.o. 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  Denies history of family premature coronary artery disease, hyperlipidemia, diabetes mellitus, MI/TIA/CVA, tobacco use.  Patient is presently on kidney [facility] list at [facility].  Patient was hospitalized 10/27/2020-10/31/2020 with worsening shortness of breath, orthopnea noted to be in new onset systolic heart failure with LVEF 25-30%.  Patient was diuresed with significant improvement of symptoms and inpatient stress test revealed no evidence of ischemia or infarction, consistent with nonischemic cardiomyopathy.    Patient presents for 3-week follow-up of heart failure and medication titration.  At last visit discontinue labetalol and switch her to metoprolol succinate 50 mg once daily.***  ***lipid panel and BMP not done?   ***  Patient presents for 2-week follow-up of heart failure and medication titration.  At last visit discontinued losartan and transitioned patient to Entresto 24/26 mg twice daily.  Patient is presently tolerating  Entresto.  Discussed with patient regarding guideline directed medical therapy for heart failure including beta-blockers, SGLT2 inhibitors, and spironolactone.  Patient is hesitant to make multiple changes at once and will prefer to only make a single medication adjustment at today's office visit.  We will therefore discontinue labetalol and switch her to metoprolol succinate 50 mg once daily given that her blood pressure is soft at today's visit.  However have advised patient that if her blood pressure remains >130/80 mmHg on home readings and heart rate is >60 bpm she may increase metoprolol to 100 mg once daily, patient verbalized understanding agreement.  Would like to [employer] as well as add SGLT2 inhibitor in the future.  Could also consider addition of spironolactone as patient is on hemodialysis.  Will obtain repeat BMP as well as lipid profile testing.  Follow-up in 3 weeks, sooner if needed, for heart failure and medication titration.   Alethia Berthold, PA-C 01/03/2021, 2:56 PM Office: [phone]

## 2021-01-03 NOTE — Progress Notes (Signed)
    Postoperative Access Visit   History of Present Illness   JEWELINE BREITLING is a 55 y.o. year old female who presents for postoperative follow-up for right upper arm radiocephalic fistula by Dr. Scot Dock on 11/20/20. The patient's wounds are well healed.  The patient notes no steal symptoms.  The patient is able to complete their activities of daily living. She did have a PD catheter placed in March of this year but decided during her training that he was no longer interested in doing at home dialysis so this was removed in June.   She is currently dialyzing via right IJ TDC  on MWF at [facility] location  Physical Examination   Vitals:   01/03/21 1054  BP: (!) 144/78  Pulse: 65  Resp: 20  Temp: 98.1 F (36.7 C)  TempSrc: Temporal  SpO2: 100%  Weight: 181 lb 1.6 oz (82.1 kg)  Height: '5\' 7"'$  (1.702 m)   Body mass index is 28.36 kg/m.  right arm Incision is well healed, 2+ radial pulse, hand grip is 5/5, sensation in digits is intact, palpable thrill, bruit can  be auscultated. The fistula is palpable in the upper arm but it is deep    Medical Decision Making   CAYLAN PATINO is a 56 y.o. year old female who presents s/p right upper arm radiocephalic fistula by Dr. Scot Dock on 11/20/20. Her incision is well healed. Her fistula has a great thrill but is deep in the upper arm clinically. Patent is without signs or symptoms of steal syndrome. She was not scheduled to have a duplex today because at the time she was anticipating doing peritoneal dialysis so she was just here today for an incision evaluation, but now she is no longer planning on doing PD. I will bring her back over the next couple weeks for a fistula duplex to evaluate to see if she will need a transposition. She will return with her fistula duplex and to be seen in the PA clinic to review her study.  Karoline Caldwell, PA-C [facility] Office: [phone]  Clinic MD: Scot Dock / One[place] Alar

## 2021-01-04 ENCOUNTER — Ambulatory Visit: Payer: 59 | Admitting: Student

## 2021-01-04 ENCOUNTER — Other Ambulatory Visit: Payer: Self-pay

## 2021-01-04 DIAGNOSIS — N186 End stage renal disease: Secondary | ICD-10-CM

## 2021-01-04 LAB — LIPID PANEL WITH LDL/HDL RATIO
Cholesterol, Total: 204 mg/dL — ABNORMAL HIGH (ref 100–199)
HDL: 72 mg/dL (ref >39)
LDL Chol Calc (NIH): 121 mg/dL — ABNORMAL HIGH (ref 0–99)
LDL/HDL Ratio: 1.7 ratio (ref 0.0–3.2)
Triglycerides: 58 mg/dL (ref 0–149)
VLDL Cholesterol Cal: 11 mg/dL (ref 5–40)

## 2021-01-04 LAB — BASIC METABOLIC PANEL
BUN/Creatinine Ratio: 3 — ABNORMAL LOW (ref 9–23)
BUN: 22 mg/dL (ref 6–24)
CO2: 30 mmol/L — ABNORMAL HIGH (ref 20–29)
Calcium: 10.1 mg/dL (ref 8.7–10.2)
Chloride: 93 mmol/L — ABNORMAL LOW (ref 96–106)
Creatinine, Ser: 6.61 mg/dL — ABNORMAL HIGH (ref 0.57–1.00)
Glucose: 69 mg/dL (ref 65–99)
Potassium: 5.3 mmol/L — ABNORMAL HIGH (ref 3.5–5.2)
Sodium: 139 mmol/L (ref 134–144)
eGFR: 7 mL/min/{1.73_m2} — ABNORMAL LOW (ref >59)

## 2021-01-04 NOTE — Progress Notes (Signed)
Called patient, NA, LMAM

## 2021-01-04 NOTE — Progress Notes (Signed)
Please advise pt potassium is up a bit, please have her make dialysis/nephrology aware. Also please have patient reschedule the appt she missed today to review further.

## 2021-01-07 NOTE — Progress Notes (Signed)
Pt aware of results, she r/s her appt for this Wednesday

## 2021-01-08 NOTE — Progress Notes (Signed)
I connected with the patient on 01/17/21 by a telephone call and verified that I am speaking with the correct person using two identifiers.     I offered the patient a video enabled application for a virtual visit. Unfortunately, this could not be accomplished due to technical difficulties/lack of video enabled phone/computer. I discussed the limitations of evaluation and management by telemedicine and the availability of in person appointments. The patient expressed understanding and agreed to proceed.   This visit type was conducted due to national recommendations for restrictions regarding the COVID-19 Pandemic (e.g. social distancing).  This format is felt to be most appropriate for this patient at this time.  All issues noted in this document were discussed and addressed.  No physical exam was performed (except for noted visual exam findings with Tele health visits).  The patient has consented to conduct a Tele health visit and understands insurance will be billed.   Primary Physician/Referring:  Maury Dus, MD  Patient ID: Meredith Reed, female    DOB: 1965/11/10, 55 y.o.   MRN: [id number]  Chief Complaint  Patient presents with   Congestive Heart Failure    HPI:    Meredith Reed  is a 55 y.o. 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  Denies history of family premature coronary artery disease, hyperlipidemia, diabetes mellitus, MI/TIA/CVA, tobacco use.  Patient is presently on kidney transplant list at [facility].  Patient was hospitalized 10/27/2020-10/31/2020 with worsening shortness of breath, orthopnea noted to be in new onset systolic heart failure with LVEF 25-30%.  Patient was diuresed with significant improvement of symptoms and inpatient stress test revealed no evidence of ischemia or infarction, consistent with nonischemic cardiomyopathy.    Patient presents  for 3-week follow-up of heart failure and medication titration. She is presently being treated by PCP for viral infection, therefore patient is being seen via telehealth visit today. At last visit discontinue labetalol and switch her to metoprolol succinate 50 mg once daily, which she is tolerating well. She denies chest pain, palpitations, dyspnea, dizziness, syncope, near syncope.  Denies orthopnea, PND, leg swelling.   Past Medical History:  Diagnosis Date   Anxiety    Breast density    Chronic kidney disease    Depression    Hypertension    Past Surgical History:  Procedure Laterality Date   AV FISTULA PLACEMENT Right 11/20/2020   Procedure: RIGHT RADIOCEPHALIC UPPER ARM ARTERIOVENOUS (AV) FISTULA CREATION;  Surgeon: Angelia Mould, MD;  Location: [facility];  Service: Vascular;  Laterality: Right;   BREAST REDUCTION SURGERY  09/16/2011   Procedure: MAMMARY REDUCTION  (BREAST);  Surgeon: Charlene Brooke, MD;  Location: [facility];  Service: Plastics;  Laterality: Bilateral;   NO PAST SURGERIES     Family History  Problem Relation Age of Onset   Emphysema Father    Hypertension Sister        x2   Deep vein thrombosis Sister    Stroke Sister    Deep vein thrombosis Paternal Grandmother    Diabetes Paternal Grandmother    Cancer Paternal Grandfather        ? type   Diabetes Paternal Grandfather    Diabetes Mother    Diabetes Maternal Aunt    Diabetes Maternal Uncle    Diabetes Maternal Uncle    Asthma Brother     Social History  Tobacco Use   Smoking status: Never   Smokeless tobacco: Never  Substance Use Topics   Alcohol use: No   Marital Status: Married   ROS  Review of Systems  Cardiovascular:  Negative for chest pain, claudication, leg swelling, near-syncope, orthopnea, palpitations, paroxysmal nocturnal dyspnea and syncope.  Respiratory:  Negative for shortness of breath.   Gastrointestinal:  Negative for melena.  Neurological:  Negative  for dizziness and weakness.   Objective  Blood pressure 137/78, pulse 68, resp. rate 17, height '5\' 7"'$  (1.702 m), weight 180 lb (81.6 kg), last menstrual period 07/28/2012.  Vitals with BMI 01/17/2021 01/09/2021 01/03/2021  Height '5\' 7"'$  '5\' 7"'$  '5\' 7"'$   Weight 179 lbs 13 oz 180 lbs 181 lbs 2 oz  BMI 28.15 [id number]  Systolic 123456 0000000 [id number]  Diastolic 85 78 78  Pulse 63 68 65    Unable to perform physical exam for today's visit due to telehealth nature of the visit. Below is exam from last office visit for reference.    Physical Exam Vitals reviewed.  Constitutional:      Appearance: She is obese.     Comments: Right radiocephalic upper arm AV fistula  HENT:     Head: Normocephalic and atraumatic.  Cardiovascular:     Rate and Rhythm: Normal rate and regular rhythm.     Pulses: Intact distal pulses.     Heart sounds: S1 normal and S2 normal. Murmur heard.  Blowing systolic murmur is present with a grade of 2/6 at the apex.    No gallop.  Pulmonary:     Effort: Pulmonary effort is normal. No respiratory distress.     Breath sounds: No wheezing, rhonchi or rales.  Musculoskeletal:     Right lower leg: No edema.     Left lower leg: No edema.  Neurological:     Mental Status: She is alert.    Laboratory examination:   Recent Labs    03/16/20 1402 04/11/20 1358 10/29/20 0531 10/30/20 0126 10/31/20 0131 11/20/20 0642 01/03/21 1125  NA 131*   < > 135 132* 130* 139 139  K 3.5   < > 3.8 3.6 3.5 3.8 5.3*  CL 91*   < > 99 95* 91* 99 93*  CO2 25   < > '27 27 27  '$ --  30*  GLUCOSE 97   < > 96 108* 110* 86 69  BUN 100*   < > 18 20 32* 23* 22  CREATININE 10.79*   < > 5.13* 4.62* 7.16* 6.60* 6.61*  CALCIUM 9.1  8.9   < > 9.2 9.4 9.2  --  10.1  GFRNONAA 4*   < > 9* 11* 6*  --   --   GFRAA 4*  --   --   --   --   --   --    < > = values in this interval not displayed.   estimated creatinine clearance is 10.6 mL/min (A) (by C-G formula based on SCr of 6.61 mg/dL (H)).  CMP Latest Ref  Rng & Units 01/03/2021 11/20/2020 10/31/2020  Glucose 65 - 99 mg/dL 69 86 110(H)  BUN 6 - 24 mg/dL 22 23(H) 32(H)  Creatinine 0.57 - 1.00 mg/dL 6.61(H) 6.60(H) 7.16(H)  Sodium 134 - 144 mmol/L 139 139 130(L)  Potassium 3.5 - 5.2 mmol/L 5.3(H) 3.8 3.5  Chloride 96 - 106 mmol/L 93(L) 99 91(L)  CO2 20 - 29 mmol/L 30(H) - 27  Calcium 8.7 - 10.2 mg/dL 10.1 -  9.2  Total Protein 6.5 - 8.1 g/dL - - -  Total Bilirubin 0.3 - 1.2 mg/dL - - -  Alkaline Phos 38 - 126 U/L - - -  AST 15 - 41 U/L - - -  ALT 0 - 44 U/L - - -   CBC Latest Ref Rng & Units 11/20/2020 10/30/2020 10/29/2020  WBC 4.0 - 10.5 K/uL - 6.2 5.6  Hemoglobin 12.0 - 15.0 g/dL 10.2(L) 9.6(L) 9.0(L)  Hematocrit 36.0 - 46.0 % 30.0(L) 29.6(L) 27.9(L)  Platelets 150 - 400 K/uL - 257 215    Lipid Panel Recent Labs    01/03/21 1126  CHOL 204*  TRIG 58  LDLCALC 121*  HDL 72    HEMOGLOBIN A1C No results found for: HGBA1C, MPG TSH Recent Labs    10/27/20 1726  TSH 1.941  BNP No results found for: BNP  ProBNP No results found for: PROBNP  External labs:  None  Allergies   Allergies  Allergen Reactions   Lisinopril Cough      Medications Prior to Visit:   Outpatient Medications Prior to Visit  Medication Sig Dispense Refill   acetaminophen (TYLENOL) 500 MG tablet Take 500-1,000 mg by mouth every 6 (six) hours as needed for mild pain (or headaches).     ALPRAZolam (XANAX) 0.25 MG tablet Take 0.25 mg by mouth daily as needed for anxiety.     amLODipine (NORVASC) 10 MG tablet Take 1 tablet (10 mg total) by mouth daily. 90 tablet 3   amoxicillin-clavulanate (AUGMENTIN) 875-125 MG tablet Take 1 tablet by mouth daily.     B Complex-C-Folic Acid (RENA-VITE RX) 1 MG TABS Take 1 tablet by mouth in the morning.     buPROPion (WELLBUTRIN XL) 300 MG 24 hr tablet Take 300 mg by mouth in the morning.     DULoxetine (CYMBALTA) 60 MG capsule Take 120 mg by mouth daily.     Homeopathic Products (THERAWORX RELIEF) LIQD Apply 1 application  topically See admin instructions. Apply to bilateral legs at bedtime     iron sucrose in sodium chloride 0.9 % 100 mL Iron Sucrose (Venofer)     metoprolol succinate (TOPROL-XL) 50 MG 24 hr tablet Take 1 tablet (50 mg total) by mouth daily. Take with or immediately following a meal. 90 tablet 3   polyethylene glycol powder (GLYCOLAX/MIRALAX) 17 GM/SCOOP powder Take 17 g by mouth as needed.     psyllium (METAMUCIL) 58.6 % packet Take 1 packet by mouth as needed.     sacubitril-valsartan (ENTRESTO) 24-26 MG Take 1 tablet by mouth 2 (two) times daily. 60 tablet 3   sevelamer carbonate (RENVELA) 800 MG tablet Take 800-2,400 mg by mouth See admin instructions. Take 2,400 mg by mouth up to three times a day with meals and 800-1,600 mg with each snack     No facility-administered medications prior to visit.     Final Medications at End of Visit    Current Meds  Medication Sig   acetaminophen (TYLENOL) 500 MG tablet Take 500-1,000 mg by mouth every 6 (six) hours as needed for mild pain (or headaches).   ALPRAZolam (XANAX) 0.25 MG tablet Take 0.25 mg by mouth daily as needed for anxiety.   amLODipine (NORVASC) 10 MG tablet Take 1 tablet (10 mg total) by mouth daily.   [EXPIRED] amoxicillin-clavulanate (AUGMENTIN) 875-125 MG tablet Take 1 tablet by mouth daily.   atorvastatin (LIPITOR) 20 MG tablet Take 1 tablet (20 mg total) by mouth daily.   B Complex-C-Folic  Acid (RENA-VITE RX) 1 MG TABS Take 1 tablet by mouth in the morning.   buPROPion (WELLBUTRIN XL) 300 MG 24 hr tablet Take 300 mg by mouth in the morning.   DULoxetine (CYMBALTA) 60 MG capsule Take 120 mg by mouth daily.   Homeopathic Products (THERAWORX RELIEF) LIQD Apply 1 application topically See admin instructions. Apply to bilateral legs at bedtime   [EXPIRED] iron sucrose in sodium chloride 0.9 % 100 mL Iron Sucrose (Venofer)   metoprolol succinate (TOPROL-XL) 50 MG 24 hr tablet Take 1 tablet (50 mg total) by mouth daily. Take with or  immediately following a meal.   polyethylene glycol powder (GLYCOLAX/MIRALAX) 17 GM/SCOOP powder Take 17 g by mouth as needed.   psyllium (METAMUCIL) 58.6 % packet Take 1 packet by mouth as needed.   sacubitril-valsartan (ENTRESTO) 24-26 MG Take 1 tablet by mouth 2 (two) times daily.   sevelamer carbonate (RENVELA) 800 MG tablet Take 800-2,400 mg by mouth See admin instructions. Take 2,400 mg by mouth up to three times a day with meals and 800-1,600 mg with each snack    Radiology:   Chest x-ray 10/27/2020: 1. Increased perihilar densities bilaterally particularly in the left suprahilar region. Findings could represent central vascular congestion or developing infection. Recommend follow-up imaging. 2. Left basilar chest densities. Findings may represent a combination of consolidation and left pleural fluid. 3. Stable position of the dialysis catheter.   CT abdomen pelvis 10/27/2020: 1. Persistent bilateral pleural effusions that may have slightly enlarged since 10/21/2020. These pleural effusions are moderate in size, right side greater than left. 2. 3 mm nodule in the left lower lobe is indeterminate. No follow-up needed if patient is low-risk. Non-contrast chest CT can be considered in 12 months if patient is high-risk. This recommendation follows the consensus statement: Guidelines for Management of Incidental Pulmonary Nodules Detected on CT Images: From the Fleischner Society 2017; Radiology 2017; 284:228-243. 3. No acute abnormality in the abdomen or pelvis. 4. Again noted is fat stranding in the right upper abdomen possibly related an evolving or resolving omental infarct. 5. Stable appearance of the peritoneal dialysis catheter. Small amount of free fluid is the likely associated with the peritoneal dialysis catheter.  Cardiac Studies:  Nuclear stress test 10/30/2020: 1. No reversible ischemia or infarction.  2. Global left ventricular hypokinesis and mild dilatation.  3.  Left ventricular ejection fraction 44%  4. Non invasive risk stratification*: Intermediate  Echocardiogram 10/27/2020: 1. Left ventricular ejection fraction, by estimation, is 20 to 25%. The  left ventricle has severely decreased function. The left ventricle  demonstrates global hypokinesis. There is mild concentric left ventricular  hypertrophy. Left ventricular diastolic   parameters are consistent with Grade I diastolic dysfunction (impaired  relaxation).   2. Right ventricular systolic function is normal. The right ventricular  size is normal. Tricuspid regurgitation signal is inadequate for assessing  PA pressure.   3. Left atrial size was mild to moderately dilated.   4. A small pericardial effusion is present. The pericardial effusion is  circumferential. There is no evidence of cardiac tamponade.   5. The mitral valve is normal in structure. Moderate mitral valve  regurgitation.   6. The aortic valve is normal in structure. Aortic valve regurgitation is  not visualized. No aortic stenosis is present.   7. The inferior vena cava is normal in size with <50% respiratory  variability, suggesting right atrial pressure of 8 mmHg.   EKG:  11/27/2020: Sinus rhythm at a rate of 71  bpm.  Normal axis.  Poor R wave progression, cannot exclude anteroseptal infarct old.  Compared to EKG 10/27/2020, lateral T wave inversions less prominent.  10/27/2020: Sinus rhythm at a rate of 80 bpm.  Normal axis.  Poor refreshing, cannot exclude anteroseptal infarct old.  Lateral T wave inversions.  Compared to EKG 08/01/2020, lateral T wave inversions new.  Assessment     ICD-10-CM   1. HFrEF (heart failure with reduced ejection fraction) (HCC)  [id number] Basic metabolic panel    2. Essential hypertension  99991111 Basic metabolic panel       There are no discontinued medications.   Meds ordered this encounter  Medications   atorvastatin (LIPITOR) 20 MG tablet    Sig: Take 1 tablet (20 mg total) by mouth  daily.    Dispense:  90 tablet    Refill:  3    Recommendations:   TAKIA MONTEL is a 55 y.o. 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  Denies history of family premature coronary artery disease, hyperlipidemia, diabetes mellitus, MI/TIA/CVA, tobacco use.  Patient is presently on kidney transplant list at [facility].  Patient was hospitalized 10/27/2020-10/31/2020 with worsening shortness of breath, orthopnea noted to be in new onset systolic heart failure with LVEF 25-30%.  Patient was diuresed with significant improvement of symptoms and inpatient stress test revealed no evidence of ischemia or infarction, consistent with nonischemic cardiomyopathy.    Patient presents for 3-week follow-up of heart failure and medication titration.  At last visit discontinue labetalol and switch her to metoprolol succinate 50 mg once daily.Patient's blood pressure is elevated on reported home reading today, however patient is uncomfortable and has been taking OTC cold medicine for viral illness. Patient will continue to monitor her BP and bring a written log to next appointment.   Lipids remain uncontrolled. Will start atorvastatin 20 mg daily and repeat lipid panel in 3 months. Will hold off on additional medication changes as patient prefers to only make one medication change at a time. Will continue current therapy otherwise. Consider addition of spironolactone and or SGLT2 inhibitor in the future.   Follow up in 3 months, sooner if needed, for heart failure and medication titration.    Alethia Berthold, PA-C 01/17/2021, 8:27 PM Office: [phone]

## 2021-01-09 ENCOUNTER — Encounter: Payer: Self-pay | Admitting: Student

## 2021-01-09 ENCOUNTER — Other Ambulatory Visit: Payer: Self-pay

## 2021-01-09 ENCOUNTER — Telehealth: Payer: 59 | Admitting: Student

## 2021-01-09 VITALS — BP 137/78 | HR 68 | Resp 17 | Ht 67.0 in | Wt 180.0 lb

## 2021-01-09 DIAGNOSIS — I5020 Unspecified systolic (congestive) heart failure: Secondary | ICD-10-CM

## 2021-01-09 DIAGNOSIS — I10 Essential (primary) hypertension: Secondary | ICD-10-CM

## 2021-01-09 MED ORDER — ATORVASTATIN CALCIUM 20 MG PO TABS: 20.0000 mg | ORAL_TABLET | Freq: Every day | ORAL | 3 refills | Status: AC

## 2021-01-17 ENCOUNTER — Other Ambulatory Visit: Payer: Self-pay

## 2021-01-17 ENCOUNTER — Ambulatory Visit (HOSPITAL_COMMUNITY)
Admission: RE | Admit: 2021-01-17 | Discharge: 2021-01-17 | Disposition: A | Discharge status: Home / Self Care | Payer: 59 | Source: Ambulatory Visit | Attending: Vascular Surgery | Admitting: Vascular Surgery

## 2021-01-17 ENCOUNTER — Ambulatory Visit (INDEPENDENT_AMBULATORY_CARE_PROVIDER_SITE_OTHER): Payer: 59 | Admitting: Physician Assistant

## 2021-01-17 VITALS — BP 154/85 | HR 63 | Temp 98.2°F | Resp 20 | Ht 67.0 in | Wt 179.8 lb

## 2021-01-17 DIAGNOSIS — Z992 Dependence on renal dialysis: Secondary | ICD-10-CM | POA: Diagnosis not present

## 2021-01-17 DIAGNOSIS — N186 End stage renal disease: Secondary | ICD-10-CM

## 2021-01-17 NOTE — Progress Notes (Signed)
Postoperative Access Visit   History of Present Illness   Meredith Reed is a 55 y.o. year old female who presents for postoperative follow-up for: right upper arm radiocephalic fistula by Dr. Scot Dock 11/20/20.  The patient's wounds are well healed.  The patient notes no steal symptoms.  The patient is able to complete their activities of daily living. She did have a PD catheter placed in March of this year but decided during her training that he was no longer interested in doing at home dialysis so this was removed in June. She returns today to review her duplex to see if she will need a transposition prior to using her access.   She is currently dialyzing via right IJ TDC  on MWF at [facility] location  Physical Examination   Vitals:   01/17/21 0850  BP: (!) 154/85  Pulse: 63  Resp: 20  Temp: 98.2 F (36.8 C)  TempSrc: Temporal  SpO2: 100%  Weight: 179 lb 12.8 oz (81.6 kg)  Height: '5\' 7"'$  (1.702 m)   Body mass index is 28.16 kg/m.  right arm Incision is well healed, 2+ radial pulse, hand grip is 5/5, sensation in digits is intact, palpable thrill, bruit can be auscultated     Non invasive vascular lab: Findings:  +--------------------+----------+-----------------+--------+  AVF                 PSV (cm/s)Flow Vol (mL/min)Comments  +--------------------+----------+-----------------+--------+  Native artery inflow   185           990                 +--------------------+----------+-----------------+--------+  AVF Anastomosis        329                               +--------------------+----------+-----------------+--------+      +------------+----------+-------------+----------+--------+  OUTFLOW VEINPSV (cm/s)Diameter (cm)Depth (cm)Describe  +------------+----------+-------------+----------+--------+  Shoulder       165        0.58        0.74             +------------+----------+-------------+----------+--------+  Prox UA        194         0.68        1.00             +------------+----------+-------------+----------+--------+  Mid UA         251        0.57        0.54             +------------+----------+-------------+----------+--------+  Dist UA        237        0.89        0.51             +------------+----------+-------------+----------+--------+  AC Fossa       514        0.60        0.23             +------------+----------+-------------+----------+--------+   Summary:  Patent right upper arm arteriovenous fistula. Tortuosity of the outflow vein noted from mid to distal upper arm.  Medical Decision Making   Meredith Reed is a 55 y.o. year old female who presents s/p right upper arm radiocephalic fistula by Dr. Scot Dock on 11/20/20. Duplex shows adequate volume flow and diameter of the fistula.  On duplex it is of adequate depth but still clinically seems deep in upper arm. It is also tortuous. I discussed with her concerns with cannulation and infiltrating the fistula as it is currently. I have recommended a transposition. Discussed risks and benefits with her and she is willing to Proceed. I will arranged this with Dr. Scot Dock in the next several weeks. She dialyzes on MWF so will try to schedule this around her dialysis schedule if possible.    Karoline Caldwell, PA-C [facility] Office: [phone]  Clinic MD: [facility]/ [facility]

## 2021-01-27 ENCOUNTER — Encounter (HOSPITAL_COMMUNITY): Payer: Self-pay | Admitting: Vascular Surgery

## 2021-01-27 NOTE — Progress Notes (Signed)
PCP - Dierdre Forth @ [place] off of [facility]. EKG - 11/27/20 Chest x-ray - na ECHO - 4/22 Cardiac Cath - na CPAP - na   Blood Thinner Instructions: na Aspirin Instructions: na    COVID TEST- na   -------------  SDW INSTRUCTIONS:  Your procedure is scheduled on 01/29/21. Please report to [facility] Main Entrance "A" at 1000 A.M., and check in at the Admitting office. Call this number if you have problems the morning of surgery: [phone]   Remember: Do not eat  after midnight the night before your surgery    As of today, STOP taking any Aspirin (unless otherwise instructed by your surgeon), Aleve, Naproxen, Ibuprofen, Motrin, Advil, Goody's, BC's, all herbal medications, fish oil, and all vitamins.    The Morning of Surgery Do not wear jewelry, make-up or nail polish. Do not wear lotions, powders, or perfumes/colognes, or deodorant Do not shave 48 hours prior to surgery.    Do not bring valuables to the hospital. [facility] is not responsible for any belongings or valuables.   Remember that you must have someone to transport you home after your surgery, and remain with you for 24 hours if you are discharged the same day.  Please bring cases for contacts, glasses, hearing aids, dentures or bridgework because it cannot be worn into surgery.   Patients discharged the day of surgery will not be allowed to drive home.   Please shower the NIGHT BEFORE/MORNING OF SURGERY (use antibacterial soap like DIAL soap if possible). Wear comfortable clothes the morning of surgery. Oral Hygiene is also important to reduce your risk of infection.  Remember - BRUSH YOUR TEETH THE MORNING OF SURGERY WITH YOUR REGULAR TOOTHPASTE  Patient denies shortness of breath, fever, cough and chest pain.

## 2021-01-29 ENCOUNTER — Encounter (HOSPITAL_COMMUNITY): Payer: Self-pay | Admitting: Vascular Surgery

## 2021-01-29 ENCOUNTER — Ambulatory Visit (HOSPITAL_COMMUNITY): Payer: 59 | Admitting: Anesthesiology

## 2021-01-29 ENCOUNTER — Ambulatory Visit (HOSPITAL_COMMUNITY)
Admission: RE | Admit: 2021-01-29 | Discharge: 2021-01-29 | Disposition: A | Discharge status: Home / Self Care | Payer: 59 | Attending: Vascular Surgery | Admitting: Vascular Surgery

## 2021-01-29 ENCOUNTER — Encounter (HOSPITAL_COMMUNITY)
Admission: RE | Disposition: A | Discharge status: Home / Self Care | Payer: Self-pay | Source: Home / Self Care | Attending: Vascular Surgery

## 2021-01-29 DIAGNOSIS — T82898A Other specified complication of vascular prosthetic devices, implants and grafts, initial encounter: Secondary | ICD-10-CM

## 2021-01-29 DIAGNOSIS — I509 Heart failure, unspecified: Secondary | ICD-10-CM | POA: Insufficient documentation

## 2021-01-29 DIAGNOSIS — I132 Hypertensive heart and chronic kidney disease with heart failure and with stage 5 chronic kidney disease, or end stage renal disease: Secondary | ICD-10-CM | POA: Diagnosis present

## 2021-01-29 DIAGNOSIS — Z992 Dependence on renal dialysis: Secondary | ICD-10-CM | POA: Insufficient documentation

## 2021-01-29 DIAGNOSIS — N186 End stage renal disease: Secondary | ICD-10-CM | POA: Insufficient documentation

## 2021-01-29 DIAGNOSIS — N185 Chronic kidney disease, stage 5: Secondary | ICD-10-CM

## 2021-01-29 HISTORY — DX: Heart failure, unspecified: I50.9

## 2021-01-29 HISTORY — PX: FISTULA SUPERFICIALIZATION: SHX6341

## 2021-01-29 LAB — POCT I-STAT, CHEM 8
BUN: 29 mg/dL — ABNORMAL HIGH (ref 6–20)
Calcium, Ion: 1.13 mmol/L — ABNORMAL LOW (ref 1.15–1.40)
Chloride: 98 mmol/L (ref 98–111)
Creatinine, Ser: 7.4 mg/dL — ABNORMAL HIGH (ref 0.44–1.00)
Glucose, Bld: 86 mg/dL (ref 70–99)
HCT: 34 % — ABNORMAL LOW (ref 36.0–46.0)
Hemoglobin: 11.6 g/dL — ABNORMAL LOW (ref 12.0–15.0)
Potassium: 4.2 mmol/L (ref 3.5–5.1)
Sodium: 136 mmol/L (ref 135–145)
TCO2: 31 mmol/L (ref 22–32)

## 2021-01-29 SURGERY — FISTULA SUPERFICIALIZATION
Anesthesia: Monitor Anesthesia Care | Site: Arm Upper | Laterality: Right

## 2021-01-29 MED ORDER — PROTAMINE SULFATE 10 MG/ML IV SOLN
INTRAVENOUS | Status: AC
  Filled 2021-01-29: qty 5

## 2021-01-29 MED ORDER — ORAL CARE MOUTH RINSE: 15.0000 mL | Freq: Once | OROMUCOSAL | Status: AC

## 2021-01-29 MED ORDER — OXYCODONE-ACETAMINOPHEN 10-325 MG PO TABS: 1.0000 | ORAL_TABLET | Freq: Four times a day (QID) | ORAL | 0 refills | Status: DC | PRN

## 2021-01-29 MED ORDER — CHLORHEXIDINE GLUCONATE 0.12 % MT SOLN
OROMUCOSAL | Status: AC
  Filled 2021-01-29: qty 15

## 2021-01-29 MED ORDER — PROPOFOL 10 MG/ML IV BOLUS
INTRAVENOUS | Status: DC | PRN
  Administered 2021-01-29: 30 mg via INTRAVENOUS
  Administered 2021-01-29: 20 mg via INTRAVENOUS

## 2021-01-29 MED ORDER — CEFAZOLIN SODIUM-DEXTROSE 2-4 GM/100ML-% IV SOLN
INTRAVENOUS | Status: AC
  Filled 2021-01-29: qty 100

## 2021-01-29 MED ORDER — HEPARIN 6000 UNIT IRRIGATION SOLUTION
Status: DC | PRN
  Administered 2021-01-29: 1

## 2021-01-29 MED ORDER — CHLORHEXIDINE GLUCONATE 0.12 % MT SOLN
15.0000 mL | Freq: Once | OROMUCOSAL | Status: AC
  Administered 2021-01-29: 15 mL via OROMUCOSAL

## 2021-01-29 MED ORDER — HEPARIN SODIUM (PORCINE) 1000 UNIT/ML IJ SOLN
INTRAMUSCULAR | Status: DC | PRN
  Administered 2021-01-29: 8000 [IU] via INTRAVENOUS

## 2021-01-29 MED ORDER — ONDANSETRON HCL 4 MG/2ML IJ SOLN
INTRAMUSCULAR | Status: DC | PRN
  Administered 2021-01-29: 4 mg via INTRAVENOUS

## 2021-01-29 MED ORDER — ONDANSETRON HCL 4 MG/2ML IJ SOLN
4.0000 mg | Freq: Four times a day (QID) | INTRAMUSCULAR | Status: DC | PRN
Start: 2021-01-29 — End: 2021-01-29

## 2021-01-29 MED ORDER — OXYCODONE HCL 5 MG PO TABS: 5.0000 mg | ORAL_TABLET | Freq: Once | ORAL | Status: DC | PRN

## 2021-01-29 MED ORDER — CHLORHEXIDINE GLUCONATE 4 % EX LIQD: 60.0000 mL | Freq: Once | CUTANEOUS | Status: DC

## 2021-01-29 MED ORDER — FENTANYL CITRATE (PF) 250 MCG/5ML IJ SOLN
INTRAMUSCULAR | Status: AC
  Filled 2021-01-29: qty 5

## 2021-01-29 MED ORDER — LIDOCAINE-EPINEPHRINE (PF) 1 %-1:200000 IJ SOLN
INTRAMUSCULAR | Status: DC | PRN
  Administered 2021-01-29: 30 mL

## 2021-01-29 MED ORDER — FENTANYL CITRATE (PF) 100 MCG/2ML IJ SOLN
INTRAMUSCULAR | Status: AC
  Filled 2021-01-29: qty 2

## 2021-01-29 MED ORDER — FENTANYL CITRATE (PF) 100 MCG/2ML IJ SOLN
INTRAMUSCULAR | Status: AC
  Administered 2021-01-29: 100 ug via INTRAVENOUS
  Filled 2021-01-29: qty 2

## 2021-01-29 MED ORDER — LIDOCAINE-EPINEPHRINE 2 %-1:100000 IJ SOLN
INTRAMUSCULAR | Status: DC | PRN
  Administered 2021-01-29: 20 mL via PERINEURAL

## 2021-01-29 MED ORDER — 0.9 % SODIUM CHLORIDE (POUR BTL) OPTIME
TOPICAL | Status: DC | PRN
  Administered 2021-01-29: 1000 mL

## 2021-01-29 MED ORDER — LIDOCAINE-EPINEPHRINE (PF) 1 %-1:200000 IJ SOLN
INTRAMUSCULAR | Status: AC
  Filled 2021-01-29: qty 30

## 2021-01-29 MED ORDER — PROTAMINE SULFATE 10 MG/ML IV SOLN
INTRAVENOUS | Status: DC | PRN
  Administered 2021-01-29: 40 mg via INTRAVENOUS

## 2021-01-29 MED ORDER — MIDAZOLAM HCL 2 MG/2ML IJ SOLN: 2.0000 mg | Freq: Once | INTRAMUSCULAR | Status: AC

## 2021-01-29 MED ORDER — MIDAZOLAM HCL 5 MG/5ML IJ SOLN
INTRAMUSCULAR | Status: DC | PRN
  Administered 2021-01-29: 1 mg via INTRAVENOUS

## 2021-01-29 MED ORDER — PROPOFOL 10 MG/ML IV BOLUS
INTRAVENOUS | Status: AC
  Filled 2021-01-29: qty 20

## 2021-01-29 MED ORDER — PROPOFOL 1000 MG/100ML IV EMUL
INTRAVENOUS | Status: AC
  Filled 2021-01-29: qty 100

## 2021-01-29 MED ORDER — PROPOFOL 500 MG/50ML IV EMUL
INTRAVENOUS | Status: DC | PRN
  Administered 2021-01-29: 50 ug/kg/min via INTRAVENOUS

## 2021-01-29 MED ORDER — HEPARIN SODIUM (PORCINE) 1000 UNIT/ML IJ SOLN
INTRAMUSCULAR | Status: AC
  Filled 2021-01-29: qty 1

## 2021-01-29 MED ORDER — OXYCODONE HCL 5 MG/5ML PO SOLN
5.0000 mg | Freq: Once | ORAL | Status: DC | PRN
Start: 2021-01-29 — End: 2021-01-29

## 2021-01-29 MED ORDER — FENTANYL CITRATE (PF) 100 MCG/2ML IJ SOLN
25.0000 ug | INTRAMUSCULAR | Status: DC | PRN
  Administered 2021-01-29: 25 ug via INTRAVENOUS

## 2021-01-29 MED ORDER — LIDOCAINE 20MG/ML (2%) 15 ML SYRINGE OPTIME
INTRAMUSCULAR | Status: DC | PRN
  Administered 2021-01-29: 15 mg via INTRAVENOUS

## 2021-01-29 MED ORDER — CEFAZOLIN SODIUM-DEXTROSE 2-4 GM/100ML-% IV SOLN
2.0000 g | INTRAVENOUS | Status: AC
  Administered 2021-01-29: 2 g via INTRAVENOUS

## 2021-01-29 MED ORDER — LACTATED RINGERS IV SOLN: INTRAVENOUS | Status: DC

## 2021-01-29 MED ORDER — MIDAZOLAM HCL 2 MG/2ML IJ SOLN
INTRAMUSCULAR | Status: AC
  Filled 2021-01-29: qty 2

## 2021-01-29 MED ORDER — PAPAVERINE HCL 30 MG/ML IJ SOLN
INTRAMUSCULAR | Status: AC
  Filled 2021-01-29: qty 2

## 2021-01-29 MED ORDER — MIDAZOLAM HCL 2 MG/2ML IJ SOLN
INTRAMUSCULAR | Status: AC
  Administered 2021-01-29: 2 mg via INTRAVENOUS
  Filled 2021-01-29: qty 2

## 2021-01-29 MED ORDER — HEPARIN 6000 UNIT IRRIGATION SOLUTION
Status: AC
  Filled 2021-01-29: qty 500

## 2021-01-29 MED ORDER — FENTANYL CITRATE (PF) 100 MCG/2ML IJ SOLN: 100.0000 ug | Freq: Once | INTRAMUSCULAR | Status: AC

## 2021-01-29 MED ORDER — SODIUM CHLORIDE 0.9 % IV SOLN: INTRAVENOUS | Status: DC

## 2021-01-29 SURGICAL SUPPLY — 32 items
ADH SKN CLS APL DERMABOND .7 (GAUZE/BANDAGES/DRESSINGS) ×1
ARMBAND PINK RESTRICT EXTREMIT (MISCELLANEOUS) ×2 IMPLANT
BAG COUNTER SPONGE SURGICOUNT (BAG) ×2 IMPLANT
BAG SPNG CNTER NS LX DISP (BAG) ×1
CANISTER SUCT 3000ML PPV (MISCELLANEOUS) ×2 IMPLANT
CANNULA VESSEL 3MM 2 BLNT TIP (CANNULA) ×2 IMPLANT
CLIP VESOCCLUDE MED 6/CT (CLIP) ×2 IMPLANT
CLIP VESOCCLUDE SM WIDE 6/CT (CLIP) ×3 IMPLANT
COVER PROBE W GEL 5X96 (DRAPES) ×2 IMPLANT
DECANTER SPIKE VIAL GLASS SM (MISCELLANEOUS) ×2 IMPLANT
DERMABOND ADVANCED (GAUZE/BANDAGES/DRESSINGS) ×1
DERMABOND ADVANCED .7 DNX12 (GAUZE/BANDAGES/DRESSINGS) ×1 IMPLANT
ELECT REM PT RETURN 9FT ADLT (ELECTROSURGICAL) ×2
ELECTRODE REM PT RTRN 9FT ADLT (ELECTROSURGICAL) ×1 IMPLANT
GLOVE SRG 8 PF TXTR STRL LF DI (GLOVE) ×1 IMPLANT
GLOVE SURG ENC MOIS LTX SZ7.5 (GLOVE) ×2 IMPLANT
GLOVE SURG UNDER POLY LF SZ8 (GLOVE) ×2
GOWN STRL REUS W/ TWL LRG LVL3 (GOWN DISPOSABLE) ×3 IMPLANT
GOWN STRL REUS W/TWL LRG LVL3 (GOWN DISPOSABLE) ×6
KIT BASIN OR (CUSTOM PROCEDURE TRAY) ×2 IMPLANT
KIT TURNOVER KIT B (KITS) ×2 IMPLANT
NS IRRIG 1000ML POUR BTL (IV SOLUTION) ×2 IMPLANT
PACK CV ACCESS (CUSTOM PROCEDURE TRAY) ×2 IMPLANT
PAD ARMBOARD 7.5X6 YLW CONV (MISCELLANEOUS) ×4 IMPLANT
SPONGE SURGIFOAM ABS GEL 100 (HEMOSTASIS) IMPLANT
SUT MNCRL AB 4-0 PS2 18 (SUTURE) ×3 IMPLANT
SUT PROLENE 6 0 BV (SUTURE) ×5 IMPLANT
SUT VIC AB 3-0 SH 27 (SUTURE) ×6
SUT VIC AB 3-0 SH 27X BRD (SUTURE) ×1 IMPLANT
TOWEL GREEN STERILE (TOWEL DISPOSABLE) ×2 IMPLANT
UNDERPAD 30X36 HEAVY ABSORB (UNDERPADS AND DIAPERS) ×2 IMPLANT
WATER STERILE IRR 1000ML POUR (IV SOLUTION) ×2 IMPLANT

## 2021-01-29 NOTE — Anesthesia Procedure Notes (Signed)
Procedure Name: MAC Date/Time: 01/29/2021 12:30 PM Performed by: Michele Rockers, CRNA Pre-anesthesia Checklist: Patient identified, Emergency Drugs available, Suction available, Timeout performed and Patient being monitored Patient Re-evaluated:Patient Re-evaluated prior to induction Oxygen Delivery Method: Nasal Cannula

## 2021-01-29 NOTE — Anesthesia Procedure Notes (Signed)
Anesthesia Regional Block: Supraclavicular block   Pre-Anesthetic Checklist: , timeout performed,  Correct Patient, Correct Site, Correct Laterality,  Correct Procedure, Correct Position, site marked,  Risks and benefits discussed,  Surgical consent,  Pre-op evaluation,  At surgeon's request and post-op pain management  Laterality: Right  Prep: chloraprep       Needles:  Injection technique: Single-shot  Needle Type: Echogenic Stimulator Needle     Needle Length: 5cm  Needle Gauge: 22     Additional Needles:   Procedures:, nerve stimulator,,,,,     Nerve Stimulator or Paresthesia:  Response: biceps flexion, 0.45 mA  Additional Responses:   Narrative:  Start time: 01/29/2021 11:43 AM End time: 01/29/2021 11:54 AM Injection made incrementally with aspirations every 5 mL.  Performed by: Personally  Anesthesiologist: Albertha Ghee, MD  Additional Notes: Functioning IV was confirmed and monitors were applied.  A 45m 22ga Arrow echogenic stimulator needle was used. Sterile prep and drape,hand hygiene and sterile gloves were used.  Negative aspiration and negative test dose prior to incremental administration of local anesthetic. The patient tolerated the procedure well.  Ultrasound guidance: relevent anatomy identified, needle position confirmed, local anesthetic spread visualized around nerve(s), vascular puncture avoided.  Image printed for medical record.

## 2021-01-29 NOTE — Transfer of Care (Signed)
Immediate Anesthesia Transfer of Care Note  Patient: Meredith Reed  Procedure(s) Performed: RIGHT RADIOCEPHALIC ARTERIOVENOUS FISTULA TRANSPOSITION (Right: Arm Upper)  Patient Location: PACU  Anesthesia Type:General  Level of Consciousness: drowsy, patient cooperative and responds to stimulation  Airway & Oxygen Therapy: Patient Spontanous Breathing  Post-op Assessment: Report given to RN and Post -op Vital signs reviewed and stable  Post vital signs: Reviewed and stable  Last Vitals:  Vitals Value Taken Time  BP 151/79 01/29/21 1440  Temp    Pulse 65 01/29/21 1440  Resp 25 01/29/21 1440  SpO2 100 % 01/29/21 1440    Last Pain:  Vitals:   01/29/21 1031  TempSrc:   PainSc: 0-No pain      Patients Stated Pain Goal: 3 ([id number])  Complications: No notable events documented.

## 2021-01-29 NOTE — Progress Notes (Signed)
Orthopedic Tech Progress Note Patient Details:  Meredith Reed 1966/02/19 [id number]  Ortho Devices Type of Ortho Device: Arm sling Ortho Device/Splint Location: R UE Ortho Device/Splint Interventions: Ordered   Post Interventions Patient Tolerated: Other (comment) (dropped off)   Dropped off R UE sling to PACU, Doren Custard RN to place on pt when more awake.   Thanks,  Verdene Lennert, PT, DPT  Acute Rehabilitation Ortho Tech Supervisor [phone] pager #[phone] office    [name] 01/29/2021, 5:18 PM

## 2021-01-29 NOTE — Interval H&P Note (Signed)
History and Physical Interval Note:  01/29/2021 11:49 AM  Meredith Reed  has presented today for surgery, with the diagnosis of ESRD.  The various methods of treatment have been discussed with the patient and family. After consideration of risks, benefits and other options for treatment, the patient has consented to  Procedure(s): RIGHT RADIOCEPHALIC ARTERIOVENOUS FISTULA TRANSPOSITION (Right) as a surgical intervention.  The patient's history has been reviewed, patient examined, no change in status, stable for surgery.  I have reviewed the patient's chart and labs.  Questions were answered to the patient's satisfaction.     Deitra Mayo

## 2021-01-29 NOTE — Anesthesia Preprocedure Evaluation (Signed)
Anesthesia Evaluation  Patient identified by MRN, date of birth, ID band Patient awake    Reviewed: Allergy & Precautions, H&P , NPO status , Patient's Chart, lab work & pertinent test results  Airway Mallampati: II   Neck ROM: full    Dental   Pulmonary neg pulmonary ROS,    breath sounds clear to auscultation       Cardiovascular hypertension, +CHF   Rhythm:regular Rate:Normal  Stress test (10/2020): no ischemia. EF 40% TTE (10/27/20): EF 20-25%   Neuro/Psych PSYCHIATRIC DISORDERS Anxiety Depression    GI/Hepatic   Endo/Other    Renal/GU ESRF and DialysisRenal disease     Musculoskeletal  (+) Arthritis ,   Abdominal   Peds  Hematology   Anesthesia Other Findings   Reproductive/Obstetrics                             Anesthesia Physical Anesthesia Plan  ASA: 4  Anesthesia Plan: Regional and MAC   Post-op Pain Management:    Induction: Intravenous  PONV Risk Score and Plan: 2 and Propofol infusion and Treatment may vary due to age or medical condition  Airway Management Planned: Nasal Cannula  Additional Equipment:   Intra-op Plan:   Post-operative Plan:   Informed Consent: I have reviewed the patients History and Physical, chart, labs and discussed the procedure including the risks, benefits and alternatives for the proposed anesthesia with the patient or authorized representative who has indicated his/her understanding and acceptance.     Dental advisory given  Plan Discussed with: Anesthesiologist, Surgeon and CRNA  Anesthesia Plan Comments:         Anesthesia Quick Evaluation

## 2021-01-29 NOTE — Op Note (Signed)
    NAME: Meredith Reed    MRN: [id number] DOB: [birth year]    DATE OF OPERATION: 01/29/2021  PREOP DIAGNOSIS:    End-stage renal disease  POSTOP DIAGNOSIS:    Same  PROCEDURE:    Revision of right radial cephalic upper arm fistula  SURGEON: Judeth Cornfield. Scot Dock, MD  ASSIST: Marlinda Mike, PA  ANESTHESIA: Axillary block  EBL: Minimal  INDICATIONS:    [name] is a 55 y.o. 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  She underwent a right radial cephalic upper arm fistula.  On her follow-up study it was noted that the fistula was deep and could not be accessed.  Therefore she was set up for revision  FINDINGS:   The right upper arm fistula was markedly tortuous and deep beneath the fascia.  I fully mobilized the vein superficialized it and resected the redundant segment.  GIVEN THAT THE SUTURE LINE IS AT THE CENTRAL INCISION IN THE UPPER ARM DO NOT CANNULATE THE FISTULA NEAR THE CENTRAL INCISION.  TECHNIQUE:   The patient was taken to the operating room and axillary block had been placed.  The right upper extremity was prepped and draped in usual sterile fashion.  Using 3 small incisions along the right upper arm overlying the fistula the fistula was dissected free circumferentially from the antecubital space all the way up to the shoulder.  Branches were divided between clips and 3-0 silk ties.  The vein was very redundant.  There was one area of the vein where it was narrow down and pulsatile proximal to this with a weaker thrill distal to this.  Ultimately I decided to excise this segment and so the vein back into end this would relieve a lot of the tortuosity and create a straight access that would be easier to cannulate.  The patient was heparinized.  The vein was clamped proximally and distally and the redundant segment was excised.  The vein was sewn back into end with two 6-0 Prolene sutures in a hand clasped fashion.  At the completion there was an  excellent thrill in the fistula.  Hemostasis was obtained in the wounds.  I then closed the deep to the wound with running 3-0 Vicryl to lift the vein closer to the skin.  This the skin was then closed with a 4-0 Monocryl.  Dermabond was applied.  The patient tolerated the procedure well.  She did have a palpable radial pulse at the completion of the procedure and an excellent thrill in her fistula.    Given the complexity of the case a first assistant was necessary in order to expedient the procedure and safely perform the technical aspects of the operation.  Deitra Mayo, MD, FACS [facility] DICTATION:   01/29/2021

## 2021-01-29 NOTE — Discharge Instructions (Signed)
° °  [facility] ° °Discharge Instructions ° °AV Fistula or Graft Surgery for Dialysis Access ° °Please refer to the following instructions for your post-procedure care. Your surgeon or physician assistant will discuss any changes with you. ° °Activity ° °You may drive the day following your surgery, if you are comfortable and no longer taking prescription pain medication. Resume full activity as the soreness in your incision resolves. ° °Bathing/Showering ° °You may shower after you go home. Keep your incision dry for 48 hours. Do not soak in a bathtub, hot tub, or swim until the incision heals completely. You may not shower if you have a hemodialysis catheter. ° °Incision Care ° °Clean your incision with mild soap and water after 48 hours. Pat the area dry with a clean towel. You do not need a bandage unless otherwise instructed. Do not apply any ointments or creams to your incision. You may have skin glue on your incision. Do not peel it off. It will come off on its own in about one week. Your arm may swell a bit after surgery. To reduce swelling use pillows to elevate your arm so it is above your heart. Your doctor will tell you if you need to lightly wrap your arm with an ACE bandage. ° °Diet ° °Resume your normal diet. There are not special food restrictions following this procedure. In order to heal from your surgery, it is CRITICAL to get adequate nutrition. Your body requires vitamins, minerals, and protein. Vegetables are the best source of vitamins and minerals. Vegetables also provide the perfect balance of protein. Processed food has little nutritional value, so try to avoid this. ° °Medications ° °Resume taking all of your medications. If your incision is causing pain, you may take over-the counter pain relievers such as acetaminophen (Tylenol). If you were prescribed a stronger pain medication, please be aware these medications can cause nausea and constipation. Prevent  nausea by taking the medication with a snack or meal. Avoid constipation by drinking plenty of fluids and eating foods with high amount of fiber, such as fruits, vegetables, and grains. Do not take Tylenol if you are taking prescription pain medications. ° ° ° ° °Follow up °Your surgeon may want to see you in the office following your access surgery. If so, this will be arranged at the time of your surgery. ° °Please call us immediately for any of the following conditions: ° °Increased pain, redness, drainage (pus) from your incision site °Fever of 101 degrees or higher °Severe or worsening pain at your incision site °Hand pain or numbness. ° °Reduce your risk of vascular disease: ° °Stop smoking. If you would like help, call [facility] at 1-800-QUIT-NOW (1-800-784-8669) or [place] at 336-586-4000 ° °Manage your cholesterol °Maintain a desired weight °Control your diabetes °Keep your blood pressure down ° °Dialysis ° °It will take several weeks to several months for your new dialysis access to be ready for use. Your surgeon will determine when it is OK to use it. Your nephrologist will continue to direct your dialysis. You can continue to use your Permcath until your new access is ready for use. ° °If you have any questions, please call the office at [phone]. ° °

## 2021-01-30 ENCOUNTER — Encounter (HOSPITAL_COMMUNITY): Payer: Self-pay | Admitting: Vascular Surgery

## 2021-01-30 NOTE — Anesthesia Postprocedure Evaluation (Signed)
Anesthesia Post Note  Patient: Meredith Reed  Procedure(s) Performed: RIGHT RADIOCEPHALIC ARTERIOVENOUS FISTULA TRANSPOSITION (Right: Arm Upper)     Patient location during evaluation: PACU Anesthesia Type: Regional and MAC Level of consciousness: awake and alert Pain management: pain level controlled Vital Signs Assessment: post-procedure vital signs reviewed and stable Respiratory status: spontaneous breathing, nonlabored ventilation, respiratory function stable and patient connected to nasal cannula oxygen Cardiovascular status: stable and blood pressure returned to baseline Postop Assessment: no apparent nausea or vomiting Anesthetic complications: no   No notable events documented.  Last Vitals:  Vitals:   01/29/21 1540 01/29/21 1545  BP: (!) 159/89   Pulse: 64 64  Resp: 15 14  Temp:  36.7 C  SpO2: 96% 96%    Last Pain:  Vitals:   01/29/21 1510  TempSrc:   PainSc: [name]

## 2021-03-14 ENCOUNTER — Other Ambulatory Visit: Payer: Self-pay

## 2021-03-14 ENCOUNTER — Ambulatory Visit (INDEPENDENT_AMBULATORY_CARE_PROVIDER_SITE_OTHER): Payer: 59 | Admitting: Physician Assistant

## 2021-03-14 VITALS — BP 132/88 | HR 85 | Temp 97.4°F | Resp 14 | Ht 65.0 in | Wt 170.0 lb

## 2021-03-14 DIAGNOSIS — Z992 Dependence on renal dialysis: Secondary | ICD-10-CM

## 2021-03-14 DIAGNOSIS — N186 End stage renal disease: Secondary | ICD-10-CM

## 2021-03-14 NOTE — Progress Notes (Signed)
  POST OPERATIVE DIALYSIS ACCESS OFFICE NOTE    CC:  F/u for dialysis access surgery   HPI:  This is a 55 y.o. female who is s/p revision/superficialization of right radial cephalic upper arm fistula 01/29/2021 by Dr. Scot Dock. Initial surgery was on 11/20/2020.  She is now 3 weeks s/p renal allograft. Denies hand pain.    Dialysis center:  [facility]  Allergies  Allergen Reactions   Lisinopril Cough    Current Outpatient Medications  Medication Sig Dispense Refill   acetaminophen (TYLENOL) 500 MG tablet Take 500 mg by mouth every 6 (six) hours as needed for mild pain or headache.     ALPRAZolam (XANAX) 0.25 MG tablet Take 0.25 mg by mouth daily as needed for anxiety.     amLODipine (NORVASC) 10 MG tablet Take 1 tablet (10 mg total) by mouth daily. 90 tablet 3   atorvastatin (LIPITOR) 20 MG tablet Take 1 tablet (20 mg total) by mouth daily. (Patient not taking: No sig reported) 90 tablet 3   B Complex-C-Folic Acid (RENA-VITE RX) 1 MG TABS Take 1 tablet by mouth in the morning.     buPROPion (WELLBUTRIN XL) 300 MG 24 hr tablet Take 300 mg by mouth in the morning.     Coenzyme Q10 (COQ-10 PO) Take 1 tablet by mouth daily.     CRANBERRY EXTRACT PO Take 1 tablet by mouth daily as needed (symptoms of UTI).     DULoxetine (CYMBALTA) 60 MG capsule Take 120 mg by mouth daily with supper.     Homeopathic Products (THERAWORX RELIEF) LIQD Apply 1 application topically at bedtime as needed (leg cramps).     Methoxy PEG-Epoetin Beta (MIRCERA IJ) Mircera     metoprolol succinate (TOPROL-XL) 50 MG 24 hr tablet Take 1 tablet (50 mg total) by mouth daily. Take with or immediately following a meal. 90 tablet 3   oxyCODONE-acetaminophen (PERCOCET) 10-325 MG tablet Take 1 tablet by mouth every 6 (six) hours as needed for pain. 20 tablet 0   polyethylene glycol powder (GLYCOLAX/MIRALAX) 17 GM/SCOOP powder Take 17 g by mouth daily as needed for mild constipation.     sacubitril-valsartan (ENTRESTO) 24-26  MG Take 1 tablet by mouth 2 (two) times daily. 60 tablet 3   sevelamer carbonate (RENVELA) 800 MG tablet Take 800-2,400 mg by mouth See admin instructions. Take 2,400 mg by mouth up to three times a day with meals and 800-1,600 mg with each snack     No current facility-administered medications for this visit.     ROS:  See HPI  LMP 07/28/2012    Physical Exam:  General appearance: awake, alert in NAD Respirations: unlabored; no dyspnea at rest Right upper extremity: Hand is warm with 5/5 grip strength. Motor function and sensation intact. Good bruit and thrill in fistula. 2+ radial pulse. Incision(s): Well healed.  Assessment/Plan:   -pt does not have evidence of steal syndrome -s/p successful renal allograft -the fistula may be used starting immediately/as needed  Barbie Banner, PA-C 03/14/2021 8:30 AM [facility] [phone]  Clinic MD:  Dr. Stanford Breed

## 2021-08-06 DIAGNOSIS — Z940 Kidney transplant status: Secondary | ICD-10-CM | POA: Diagnosis not present

## 2021-08-06 DIAGNOSIS — Z5181 Encounter for therapeutic drug level monitoring: Secondary | ICD-10-CM | POA: Diagnosis not present

## 2021-08-06 DIAGNOSIS — B349 Viral infection, unspecified: Secondary | ICD-10-CM | POA: Diagnosis not present

## 2021-08-06 DIAGNOSIS — B259 Cytomegaloviral disease, unspecified: Secondary | ICD-10-CM | POA: Diagnosis not present

## 2021-09-07 NOTE — Telephone Encounter (Signed)
See telephone document ?

## 2021-09-13 DIAGNOSIS — Z940 Kidney transplant status: Secondary | ICD-10-CM | POA: Diagnosis not present

## 2021-09-13 DIAGNOSIS — D849 Immunodeficiency, unspecified: Secondary | ICD-10-CM | POA: Diagnosis not present

## 2021-09-26 DIAGNOSIS — Z79899 Other long term (current) drug therapy: Secondary | ICD-10-CM | POA: Diagnosis not present

## 2021-09-26 DIAGNOSIS — Z940 Kidney transplant status: Secondary | ICD-10-CM | POA: Diagnosis not present

## 2021-09-26 DIAGNOSIS — Z4822 Encounter for aftercare following kidney transplant: Secondary | ICD-10-CM | POA: Diagnosis not present

## 2021-09-26 DIAGNOSIS — D84821 Immunodeficiency due to drugs: Secondary | ICD-10-CM | POA: Diagnosis not present

## 2021-09-26 DIAGNOSIS — D849 Immunodeficiency, unspecified: Secondary | ICD-10-CM | POA: Diagnosis not present

## 2021-12-17 DIAGNOSIS — Z0000 Encounter for general adult medical examination without abnormal findings: Secondary | ICD-10-CM | POA: Diagnosis not present

## 2021-12-17 DIAGNOSIS — N186 End stage renal disease: Secondary | ICD-10-CM | POA: Diagnosis not present

## 2021-12-17 DIAGNOSIS — Z940 Kidney transplant status: Secondary | ICD-10-CM | POA: Diagnosis not present

## 2021-12-17 DIAGNOSIS — E7800 Pure hypercholesterolemia, unspecified: Secondary | ICD-10-CM | POA: Diagnosis not present

## 2021-12-17 DIAGNOSIS — I129 Hypertensive chronic kidney disease with stage 1 through stage 4 chronic kidney disease, or unspecified chronic kidney disease: Secondary | ICD-10-CM | POA: Diagnosis not present

## 2021-12-17 DIAGNOSIS — Z832 Family history of diseases of the blood and blood-forming organs and certain disorders involving the immune mechanism: Secondary | ICD-10-CM | POA: Diagnosis not present

## 2021-12-26 DIAGNOSIS — D849 Immunodeficiency, unspecified: Secondary | ICD-10-CM | POA: Diagnosis not present

## 2021-12-26 DIAGNOSIS — Z940 Kidney transplant status: Secondary | ICD-10-CM | POA: Diagnosis not present

## 2022-01-10 ENCOUNTER — Other Ambulatory Visit: Payer: Self-pay | Admitting: Gastroenterology

## 2022-01-17 ENCOUNTER — Encounter (HOSPITAL_COMMUNITY): Payer: Self-pay

## 2022-01-22 ENCOUNTER — Encounter (HOSPITAL_COMMUNITY): Payer: Self-pay | Admitting: Gastroenterology

## 2022-01-23 ENCOUNTER — Encounter (HOSPITAL_COMMUNITY): Payer: Self-pay

## 2022-01-28 ENCOUNTER — Encounter (HOSPITAL_COMMUNITY): Payer: Self-pay

## 2022-01-29 ENCOUNTER — Ambulatory Visit (HOSPITAL_COMMUNITY)
Admission: RE | Admit: 2022-01-29 | Discharge: 2022-01-29 | Disposition: A | Discharge status: Home / Self Care | Payer: Medicare Other | Attending: Gastroenterology | Admitting: Gastroenterology

## 2022-01-29 ENCOUNTER — Ambulatory Visit (HOSPITAL_COMMUNITY): Payer: Medicare Other | Admitting: Anesthesiology

## 2022-01-29 ENCOUNTER — Encounter (HOSPITAL_COMMUNITY)
Admission: RE | Disposition: A | Discharge status: Home / Self Care | Payer: Self-pay | Source: Home / Self Care | Attending: Gastroenterology

## 2022-01-29 ENCOUNTER — Encounter (HOSPITAL_COMMUNITY): Payer: Self-pay | Admitting: Gastroenterology

## 2022-01-29 ENCOUNTER — Other Ambulatory Visit: Payer: Self-pay

## 2022-01-29 ENCOUNTER — Ambulatory Visit (HOSPITAL_BASED_OUTPATIENT_CLINIC_OR_DEPARTMENT_OTHER): Payer: Medicare Other | Admitting: Anesthesiology

## 2022-01-29 DIAGNOSIS — Z940 Kidney transplant status: Secondary | ICD-10-CM | POA: Diagnosis not present

## 2022-01-29 DIAGNOSIS — I10 Essential (primary) hypertension: Secondary | ICD-10-CM

## 2022-01-29 DIAGNOSIS — I509 Heart failure, unspecified: Secondary | ICD-10-CM | POA: Insufficient documentation

## 2022-01-29 DIAGNOSIS — I110 Hypertensive heart disease with heart failure: Secondary | ICD-10-CM | POA: Insufficient documentation

## 2022-01-29 DIAGNOSIS — N289 Disorder of kidney and ureter, unspecified: Secondary | ICD-10-CM

## 2022-01-29 DIAGNOSIS — K648 Other hemorrhoids: Secondary | ICD-10-CM

## 2022-01-29 DIAGNOSIS — Z86010 Personal history of colonic polyps: Secondary | ICD-10-CM | POA: Diagnosis not present

## 2022-01-29 DIAGNOSIS — Z79899 Other long term (current) drug therapy: Secondary | ICD-10-CM | POA: Diagnosis not present

## 2022-01-29 DIAGNOSIS — Z1211 Encounter for screening for malignant neoplasm of colon: Secondary | ICD-10-CM | POA: Diagnosis not present

## 2022-01-29 HISTORY — PX: COLONOSCOPY WITH PROPOFOL: SHX5780

## 2022-01-29 SURGERY — COLONOSCOPY WITH PROPOFOL
Anesthesia: Monitor Anesthesia Care | Laterality: Bilateral

## 2022-01-29 MED ORDER — PROPOFOL 500 MG/50ML IV EMUL
INTRAVENOUS | Status: DC | PRN
  Administered 2022-01-29: 75 ug/kg/min via INTRAVENOUS

## 2022-01-29 MED ORDER — SODIUM CHLORIDE 0.9 % IV SOLN: INTRAVENOUS | Status: DC

## 2022-01-29 MED ORDER — PROPOFOL 10 MG/ML IV BOLUS
INTRAVENOUS | Status: DC | PRN
  Administered 2022-01-29 (×3): 20 mg via INTRAVENOUS

## 2022-01-29 MED ORDER — LACTATED RINGERS IV SOLN: INTRAVENOUS | Status: DC

## 2022-01-29 SURGICAL SUPPLY — 22 items

## 2022-01-29 NOTE — Anesthesia Preprocedure Evaluation (Signed)
Anesthesia Evaluation  Patient identified by MRN, date of birth, ID band Patient awake    Reviewed: Allergy & Precautions, NPO status , Patient's Chart, lab work & pertinent test results  Airway Mallampati: II  TM Distance: >3 FB Neck ROM: Full    Dental no notable dental hx.    Pulmonary neg pulmonary ROS,    Pulmonary exam normal breath sounds clear to auscultation       Cardiovascular hypertension, Pt. on medications and Pt. on home beta blockers Normal cardiovascular exam Rhythm:Regular Rate:Normal     Neuro/Psych negative neurological ROS  negative psych ROS   GI/Hepatic negative GI ROS, Neg liver ROS,   Endo/Other  negative endocrine ROS  Renal/GU DialysisRenal disease  negative genitourinary   Musculoskeletal negative musculoskeletal ROS (+)   Abdominal   Peds negative pediatric ROS (+)  Hematology negative hematology ROS (+)   Anesthesia Other Findings   Reproductive/Obstetrics negative OB ROS                             Anesthesia Physical Anesthesia Plan  ASA: 3  Anesthesia Plan: MAC   Post-op Pain Management: Minimal or no pain anticipated   Induction: Intravenous  PONV Risk Score and Plan: 2 and Propofol infusion and Treatment may vary due to age or medical condition  Airway Management Planned: Simple Face Mask  Additional Equipment:   Intra-op Plan:   Post-operative Plan:   Informed Consent: I have reviewed the patients History and Physical, chart, labs and discussed the procedure including the risks, benefits and alternatives for the proposed anesthesia with the patient or authorized representative who has indicated his/her understanding and acceptance.     Dental advisory given  Plan Discussed with: CRNA and Surgeon  Anesthesia Plan Comments:         Anesthesia Quick Evaluation

## 2022-01-29 NOTE — Transfer of Care (Signed)
Immediate Anesthesia Transfer of Care Note  Patient: Meredith Reed  Procedure(s) Performed: COLONOSCOPY WITH PROPOFOL (Bilateral)  Patient Location: Endoscopy Unit  Anesthesia Type:MAC  Level of Consciousness: awake, alert  and oriented  Airway & Oxygen Therapy: Patient Spontanous Breathing and Patient connected to face mask oxygen  Post-op Assessment: Report given to RN and Post -op Vital signs reviewed and stable  Post vital signs: Reviewed and stable  Last Vitals:  Vitals Value Taken Time  BP 130/79 01/29/22 1111  Temp 36.4 C 01/29/22 1111  Pulse 64 01/29/22 1112  Resp 16 01/29/22 1112  SpO2 100 % 01/29/22 1112  Vitals shown include unvalidated device data.  Last Pain:  Vitals:   01/29/22 1111  TempSrc: Tympanic  PainSc: 0-No pain         Complications: No notable events documented.

## 2022-01-29 NOTE — Anesthesia Postprocedure Evaluation (Signed)
Anesthesia Post Note  Patient: Meredith Reed  Procedure(s) Performed: COLONOSCOPY WITH PROPOFOL (Bilateral)     Patient location during evaluation: PACU Anesthesia Type: MAC Level of consciousness: awake and alert Pain management: pain level controlled Vital Signs Assessment: post-procedure vital signs reviewed and stable Respiratory status: spontaneous breathing, nonlabored ventilation, respiratory function stable and patient connected to nasal cannula oxygen Cardiovascular status: stable and blood pressure returned to baseline Postop Assessment: no apparent nausea or vomiting Anesthetic complications: no   No notable events documented.  Last Vitals:  Vitals:   01/29/22 1111 01/29/22 1112  BP: 130/79 130/79  Pulse: 63 64  Resp: 16 16  Temp: (!) 36.4 C   SpO2: 100% 100%    Last Pain:  Vitals:   01/29/22 1111  TempSrc: Tympanic  PainSc: 0-No pain                 [name] S

## 2022-01-29 NOTE — H&P (Signed)
[facility] H/P Note  Chief Complaint: personal history colonic polyps  HPI: Meredith Reed is an 56 y.o. female.  Personal history of colonic polyps.  Last colonoscopy 2017 two small adenomas removed.  No abdominal pain, blood in stool.  Kidney transplant about one year ago.  Past Medical History:  Diagnosis Date   Anxiety    Breast density    CHF (congestive heart failure) ([place])    Chronic kidney disease    Depression    Hypertension     Past Surgical History:  Procedure Laterality Date   AV FISTULA PLACEMENT Right 11/20/2020   Procedure: RIGHT RADIOCEPHALIC UPPER ARM ARTERIOVENOUS (AV) FISTULA CREATION;  Surgeon: Angelia Mould, MD;  Location: [place];  Service: Vascular;  Laterality: Right;   BREAST REDUCTION SURGERY  09/16/2011   Procedure: MAMMARY REDUCTION  (BREAST);  Surgeon: Charlene Brooke, MD;  Location: [place];  Service: Plastics;  Laterality: Bilateral;   FISTULA SUPERFICIALIZATION Right 01/29/2021   Procedure: RIGHT RADIOCEPHALIC ARTERIOVENOUS FISTULA TRANSPOSITION;  Surgeon: Angelia Mould, MD;  [facility];  Service: Vascular;  Laterality: Right;   INSERTION OF DIALYSIS CATHETER Right 05/2020   NO PAST SURGERIES      Medications Prior to Admission  Medication Sig Dispense Refill   amLODipine (NORVASC) 5 MG tablet Take 5 mg by mouth daily as needed (if bp is over 140/85).     aspirin 81 MG EC tablet Take 81 mg by mouth daily.     AZO-CRANBERRY PO Take 2 capsules by mouth daily.     buPROPion (WELLBUTRIN XL) 150 MG 24 hr tablet Take 150 mg by mouth daily with lunch.     buPROPion (WELLBUTRIN XL) 300 MG 24 hr tablet Take 300 mg by mouth in the morning.     DULoxetine (CYMBALTA) 60 MG capsule Take 60 mg by mouth daily.     famotidine (PEPCID) 20 MG tablet Take 20 mg by mouth at bedtime.     Homeopathic Products (AZO YEAST PLUS) TABS Take 2 tablets by mouth daily.     melatonin 5 MG TABS Take 1.25 mg by mouth at  bedtime as needed (sleep).     metoprolol succinate (TOPROL-XL) 25 MG 24 hr tablet Take 25 mg by mouth daily.     mycophenolate (CELLCEPT) 250 MG capsule Take 750 mg by mouth 2 (two) times daily.     predniSONE (DELTASONE) 5 MG tablet Take 5 mg by mouth daily.     Probiotic Product (PROBIOTIC PO) Take 1 capsule by mouth daily.     sulfamethoxazole-trimethoprim (BACTRIM DS) 800-160 MG tablet Take 1 tablet by mouth every Monday, Wednesday, and Friday.     tacrolimus (PROGRAF) 1 MG capsule Take 2 mg by mouth 2 (two) times daily.     amLODipine (NORVASC) 10 MG tablet Take 1 tablet (10 mg total) by mouth daily. (Patient not taking: Reported on 03/14/2021) 90 tablet 3   atorvastatin (LIPITOR) 20 MG tablet Take 1 tablet (20 mg total) by mouth daily. (Patient not taking: Reported on 01/23/2022) 90 tablet 3   sacubitril-valsartan (ENTRESTO) 24-26 MG Take 1 tablet by mouth 2 (two) times daily. (Patient not taking: Reported on 03/14/2021) 60 tablet 3    Allergies:  Allergies  Allergen Reactions   Lisinopril Cough    Family History  Problem Relation Age of Onset   Emphysema Father    Hypertension Sister        x2   Deep vein thrombosis Sister  Stroke Sister    Deep vein thrombosis Paternal Grandmother    Diabetes Paternal Grandmother    Cancer Paternal Grandfather        ? type   Diabetes Paternal Grandfather    Diabetes Mother    Diabetes Maternal Aunt    Diabetes Maternal Uncle    Diabetes Maternal Uncle    Asthma Brother     Social History:  reports that she has never smoked. She has never used smokeless tobacco. She reports that she does not drink alcohol and does not use drugs.   ROS: As per HPI, all others negative   Blood pressure 127/87, pulse 70, temperature 98.5 F (36.9 C), temperature source Oral, resp. rate 12, height 5\' 6"  (1.676 m), weight 93 kg, last menstrual period 07/28/2012, SpO2 99 %. General appearance: NAD, overweight NECK:  Supple, no JVD CV:  Regular HEENT:   [place]/AT, no icterus ABD:  Soft, protuberant, non-tender NEURO:  A/O, non focal, no encephalopathy  No results found for this or any previous visit (from the past 48 hour(s)). No results found.  Assessment/Plan   Personal history of colonic polyps. Heart failure. Kidney transplant ~ one year ago. Colonoscopy. Risks (bleeding, infection, bowel perforation that could require surgery, sedation-related changes in cardiopulmonary systems), benefits (identification and possible treatment of source of symptoms, exclusion of certain causes of symptoms), and alternatives (watchful waiting, radiographic imaging studies, empiric medical treatment) of colonoscopy were explained to patient/family in detail and patient wishes to proceed.   Landry Dyke 01/29/2022, 10:35 AM

## 2022-01-29 NOTE — Op Note (Signed)
[facility] Patient Name: Meredith Reed Procedure Date: 01/29/2022 MRN: [id number] Attending MD: Arta Silence , MD Date of Birth: 1966/04/17 CSN: [id number] Age: 56 Admit Type: Outpatient Procedure:                Colonoscopy Indications:              High risk colon cancer surveillance: Personal                            history of colonic polyps Providers:                Arta Silence, MD, Dulcy Fanny, Despina Pole, Technician Referring MD:              Medicines:                 Complications:            No immediate complications. Estimated Blood Loss:     Estimated blood loss: none. Procedure:                Pre-Anesthesia Assessment:                           - Prior to the procedure, a History and Physical                            was performed, and patient medications and                            allergies were reviewed. The patient's tolerance of                            previous anesthesia was also reviewed. The risks                            and benefits of the procedure and the sedation                            options and risks were discussed with the patient.                            All questions were answered, and informed consent                            was obtained. Prior Anticoagulants: The patient has                            taken no previous anticoagulant or antiplatelet                            agents except for aspirin. ASA Grade Assessment:                            III - A patient with severe systemic  disease. After                            reviewing the risks and benefits, the patient was                            deemed in satisfactory condition to undergo the                            procedure.                           After obtaining informed consent, the colonoscope                            was passed under direct vision. Throughout the                             procedure, the patient's blood pressure, pulse, and                            oxygen saturations were monitored continuously. The                            PCF-HQ190L ([id number]) Olympus colonoscope was                            introduced through the anus and advanced to the the                            cecum, identified by appendiceal orifice and                            ileocecal valve. The colonoscopy was performed                            without difficulty. The ileocecal valve,                            appendiceal orifice, and rectum were photographed.                            The entire colon was examined. The patient                            tolerated the procedure well. The quality of the                            bowel preparation was good. Scope In: 10:48:41 AM Scope Out: 11:05:00 AM Scope Withdrawal Time: 0 hours 11 minutes 15 seconds  Total Procedure Duration: 0 hours 16 minutes 19 seconds  Findings:      The perianal and digital rectal examinations were normal.      Internal hemorrhoids were found during retroflexion. The hemorrhoids       were mild.  No additional abnormalities were found on retroflexion.      The colon (entire examined portion) appeared normal. Impression:               - Internal hemorrhoids.                           - The entire examined colon is normal.                           - The entire examined colon is normal on direct and                            retroflexion views. Moderate Sedation:      None Recommendation:           - Patient has a contact number available for                            emergencies. The signs and symptoms of potential                            delayed complications were discussed with the                            patient. Return to normal activities tomorrow.                            Written discharge instructions were provided to the                            patient.                            - Discharge patient to home (ambulatory).                           - High fiber diet indefinitely.                           - Continue present medications.                           - Repeat colonoscopy in 5 years for surveillance.                           - Return to GI clinic PRN.                           - Return to referring physician PRN. Procedure Code(s):        --- Professional ---                           [phone], Colonoscopy, flexible; diagnostic, including                            collection of specimen(s) by brushing or washing,  when performed (separate procedure) Diagnosis Code(s):        --- Professional ---                           Z86.010, Personal history of colonic polyps                           K64.8, Other hemorrhoids CPT copyright 2019 [employer]. All rights reserved. The codes documented in this report are preliminary and upon coder review may  be revised to meet current compliance requirements. Arta Silence, MD 01/29/2022 11:21:18 AM This report has been signed electronically. Number of Addenda: 0

## 2022-01-29 NOTE — Discharge Instructions (Signed)
Colonoscopy ° °Post procedure instructions: ° °Read the instructions outlined below and refer to this sheet in the next few weeks. These discharge instructions provide you with general information on caring for yourself after you leave the hospital. Your doctor may also give you specific instructions. While your treatment has been planned according to the most current medical practices available, unavoidable complications occasionally occur. If you have any problems or questions after discharge, call Dr. Darien Mignogna at [facility] ([phone]). ° °HOME CARE INSTRUCTIONS ° °ACTIVITY: °· You may resume your regular activity, but move at a slower pace for the next 24 hours.  °· Take frequent rest periods for the next 24 hours.  °· Walking will help get rid of the air and reduce the bloated feeling in your belly (abdomen).  °· No driving for 24 hours (because of the medicine (anesthesia) used during the test).  °· You may shower.  °· Do not sign any important legal documents or operate any machinery for 24 hours (because of the anesthesia used during the test).  °NUTRITION: °· Drink plenty of fluids.  °· You may resume your normal diet as instructed by your doctor.  °· Begin with a light meal and progress to your normal diet. Heavy or fried foods are harder to digest and may make you feel sick to your stomach (nauseated).  °· Avoid alcoholic beverages for 24 hours or as instructed.  °MEDICATIONS: °· You may resume your normal medications unless your doctor tells you otherwise.  °WHAT TO EXPECT TODAY: °· Some feelings of bloating in the abdomen.  °· Passage of more gas than usual.  °· Spotting of blood in your stool or on the toilet paper.  °IF YOU HAD POLYPS REMOVED DURING THE COLONOSCOPY: °· No aspirin products for 7 days or as instructed.  °· No alcohol for 7 days or as instructed.  °· Eat a soft diet for the next 24 hours.  ° °FINDING OUT THE RESULTS OF YOUR TEST ° °Not all test results are available during your  visit. If your test results are not back during the visit, make an appointment with your caregiver to find out the results. Do not assume everything is normal if you have not heard from your caregiver or the medical facility. It is important for you to follow up on all of your test results.  ° ° ° °SEEK IMMEDIATE MEDICAL CARE IF: ° °· You have more than a spotting of blood in your stool.  °· Your belly is swollen (abdominal distention).  °· You are nauseated or vomiting.  °· You have a fever.  °· You have abdominal pain or discomfort that is severe or gets worse throughout the day.  ° ° °Document Released: 01/29/2004 Document Revised: 02/26/2011 Document Reviewed: 01/27/2008 °[employer]® Patient Information ©2012 [employer]. ° °

## 2022-01-31 ENCOUNTER — Encounter (HOSPITAL_COMMUNITY): Payer: Self-pay | Admitting: Gastroenterology

## 2022-02-12 DIAGNOSIS — Z1231 Encounter for screening mammogram for malignant neoplasm of breast: Secondary | ICD-10-CM | POA: Diagnosis not present

## 2022-04-11 DIAGNOSIS — T380X5A Adverse effect of glucocorticoids and synthetic analogues, initial encounter: Secondary | ICD-10-CM | POA: Diagnosis not present

## 2022-04-11 DIAGNOSIS — R739 Hyperglycemia, unspecified: Secondary | ICD-10-CM | POA: Diagnosis not present

## 2022-04-11 DIAGNOSIS — Z940 Kidney transplant status: Secondary | ICD-10-CM | POA: Diagnosis not present

## 2022-04-11 DIAGNOSIS — D849 Immunodeficiency, unspecified: Secondary | ICD-10-CM | POA: Diagnosis not present

## 2022-04-21 DIAGNOSIS — Z940 Kidney transplant status: Secondary | ICD-10-CM | POA: Diagnosis not present

## 2022-06-13 DIAGNOSIS — Z23 Encounter for immunization: Secondary | ICD-10-CM | POA: Diagnosis not present

## 2022-06-13 DIAGNOSIS — Z940 Kidney transplant status: Secondary | ICD-10-CM | POA: Diagnosis not present

## 2022-06-13 DIAGNOSIS — I5020 Unspecified systolic (congestive) heart failure: Secondary | ICD-10-CM | POA: Diagnosis not present

## 2022-06-13 DIAGNOSIS — I10 Essential (primary) hypertension: Secondary | ICD-10-CM | POA: Diagnosis not present

## 2022-07-16 DIAGNOSIS — J209 Acute bronchitis, unspecified: Secondary | ICD-10-CM | POA: Diagnosis not present

## 2022-07-16 DIAGNOSIS — Z79899 Other long term (current) drug therapy: Secondary | ICD-10-CM | POA: Diagnosis not present

## 2022-08-22 DIAGNOSIS — Z79899 Other long term (current) drug therapy: Secondary | ICD-10-CM | POA: Diagnosis not present

## 2022-08-22 DIAGNOSIS — Z940 Kidney transplant status: Secondary | ICD-10-CM | POA: Diagnosis not present

## 2022-08-22 DIAGNOSIS — R7989 Other specified abnormal findings of blood chemistry: Secondary | ICD-10-CM | POA: Diagnosis not present

## 2022-08-22 DIAGNOSIS — Z7185 Encounter for immunization safety counseling: Secondary | ICD-10-CM | POA: Diagnosis not present

## 2022-08-22 DIAGNOSIS — I10 Essential (primary) hypertension: Secondary | ICD-10-CM | POA: Diagnosis not present

## 2022-08-22 DIAGNOSIS — R944 Abnormal results of kidney function studies: Secondary | ICD-10-CM | POA: Diagnosis not present

## 2022-09-16 DIAGNOSIS — Z5181 Encounter for therapeutic drug level monitoring: Secondary | ICD-10-CM | POA: Diagnosis not present

## 2022-09-16 DIAGNOSIS — Z1159 Encounter for screening for other viral diseases: Secondary | ICD-10-CM | POA: Diagnosis not present

## 2022-09-16 DIAGNOSIS — Z940 Kidney transplant status: Secondary | ICD-10-CM | POA: Diagnosis not present

## 2022-11-18 DIAGNOSIS — Z5181 Encounter for therapeutic drug level monitoring: Secondary | ICD-10-CM | POA: Diagnosis not present

## 2022-11-18 DIAGNOSIS — Z940 Kidney transplant status: Secondary | ICD-10-CM | POA: Diagnosis not present

## 2022-11-18 DIAGNOSIS — Z1159 Encounter for screening for other viral diseases: Secondary | ICD-10-CM | POA: Diagnosis not present

## 2022-12-19 DIAGNOSIS — Z940 Kidney transplant status: Secondary | ICD-10-CM | POA: Diagnosis not present

## 2022-12-19 DIAGNOSIS — Z1159 Encounter for screening for other viral diseases: Secondary | ICD-10-CM | POA: Diagnosis not present

## 2022-12-19 DIAGNOSIS — Z5181 Encounter for therapeutic drug level monitoring: Secondary | ICD-10-CM | POA: Diagnosis not present

## 2022-12-22 DIAGNOSIS — Z48298 Encounter for aftercare following other organ transplant: Secondary | ICD-10-CM | POA: Diagnosis not present

## 2022-12-22 DIAGNOSIS — B348 Other viral infections of unspecified site: Secondary | ICD-10-CM | POA: Diagnosis not present

## 2022-12-22 DIAGNOSIS — I151 Hypertension secondary to other renal disorders: Secondary | ICD-10-CM | POA: Diagnosis not present

## 2022-12-22 DIAGNOSIS — D849 Immunodeficiency, unspecified: Secondary | ICD-10-CM | POA: Diagnosis not present

## 2022-12-22 DIAGNOSIS — L299 Pruritus, unspecified: Secondary | ICD-10-CM | POA: Diagnosis not present

## 2022-12-22 DIAGNOSIS — Z940 Kidney transplant status: Secondary | ICD-10-CM | POA: Diagnosis not present

## 2023-01-06 DIAGNOSIS — H52203 Unspecified astigmatism, bilateral: Secondary | ICD-10-CM | POA: Diagnosis not present

## 2023-01-06 DIAGNOSIS — H5213 Myopia, bilateral: Secondary | ICD-10-CM | POA: Diagnosis not present

## 2023-01-06 DIAGNOSIS — H31003 Unspecified chorioretinal scars, bilateral: Secondary | ICD-10-CM | POA: Diagnosis not present

## 2023-01-09 DIAGNOSIS — Z0000 Encounter for general adult medical examination without abnormal findings: Secondary | ICD-10-CM | POA: Diagnosis not present

## 2023-01-09 DIAGNOSIS — Z940 Kidney transplant status: Secondary | ICD-10-CM | POA: Diagnosis not present

## 2023-01-09 DIAGNOSIS — E7800 Pure hypercholesterolemia, unspecified: Secondary | ICD-10-CM | POA: Diagnosis not present

## 2023-01-09 DIAGNOSIS — D84821 Immunodeficiency due to drugs: Secondary | ICD-10-CM | POA: Diagnosis not present

## 2023-01-09 DIAGNOSIS — I10 Essential (primary) hypertension: Secondary | ICD-10-CM | POA: Diagnosis not present

## 2023-01-09 DIAGNOSIS — I509 Heart failure, unspecified: Secondary | ICD-10-CM | POA: Diagnosis not present

## 2023-01-09 DIAGNOSIS — Z1159 Encounter for screening for other viral diseases: Secondary | ICD-10-CM | POA: Diagnosis not present

## 2023-01-09 DIAGNOSIS — Z7969 Long term (current) use of other immunomodulators and immunosuppressants: Secondary | ICD-10-CM | POA: Diagnosis not present

## 2023-01-09 DIAGNOSIS — I130 Hypertensive heart and chronic kidney disease with heart failure and stage 1 through stage 4 chronic kidney disease, or unspecified chronic kidney disease: Secondary | ICD-10-CM | POA: Diagnosis not present

## 2023-01-09 DIAGNOSIS — N1831 Chronic kidney disease, stage 3a: Secondary | ICD-10-CM | POA: Diagnosis not present

## 2023-02-04 DIAGNOSIS — Z940 Kidney transplant status: Secondary | ICD-10-CM | POA: Diagnosis not present

## 2023-02-04 DIAGNOSIS — Z5181 Encounter for therapeutic drug level monitoring: Secondary | ICD-10-CM | POA: Diagnosis not present

## 2023-02-04 DIAGNOSIS — Z1159 Encounter for screening for other viral diseases: Secondary | ICD-10-CM | POA: Diagnosis not present

## 2023-03-05 DIAGNOSIS — Z1159 Encounter for screening for other viral diseases: Secondary | ICD-10-CM | POA: Diagnosis not present

## 2023-03-05 DIAGNOSIS — Z940 Kidney transplant status: Secondary | ICD-10-CM | POA: Diagnosis not present

## 2023-03-05 DIAGNOSIS — Z5181 Encounter for therapeutic drug level monitoring: Secondary | ICD-10-CM | POA: Diagnosis not present

## 2023-07-28 DIAGNOSIS — Z940 Kidney transplant status: Secondary | ICD-10-CM | POA: Diagnosis not present

## 2023-07-28 DIAGNOSIS — Z5181 Encounter for therapeutic drug level monitoring: Secondary | ICD-10-CM | POA: Diagnosis not present

## 2023-07-28 DIAGNOSIS — Z1159 Encounter for screening for other viral diseases: Secondary | ICD-10-CM | POA: Diagnosis not present

## 2023-08-10 DIAGNOSIS — G4711 Idiopathic hypersomnia with long sleep time: Secondary | ICD-10-CM | POA: Diagnosis not present

## 2023-08-10 DIAGNOSIS — I151 Hypertension secondary to other renal disorders: Secondary | ICD-10-CM | POA: Diagnosis not present

## 2023-08-10 DIAGNOSIS — Z48298 Encounter for aftercare following other organ transplant: Secondary | ICD-10-CM | POA: Diagnosis not present

## 2023-08-10 DIAGNOSIS — D849 Immunodeficiency, unspecified: Secondary | ICD-10-CM | POA: Diagnosis not present

## 2023-08-10 DIAGNOSIS — Z940 Kidney transplant status: Secondary | ICD-10-CM | POA: Diagnosis not present

## 2023-08-10 DIAGNOSIS — R413 Other amnesia: Secondary | ICD-10-CM | POA: Diagnosis not present

## 2023-09-04 DIAGNOSIS — J029 Acute pharyngitis, unspecified: Secondary | ICD-10-CM | POA: Diagnosis not present

## 2023-10-02 DIAGNOSIS — Z940 Kidney transplant status: Secondary | ICD-10-CM | POA: Diagnosis not present

## 2023-10-02 DIAGNOSIS — F4329 Adjustment disorder with other symptoms: Secondary | ICD-10-CM | POA: Diagnosis not present

## 2023-10-02 DIAGNOSIS — F09 Unspecified mental disorder due to known physiological condition: Secondary | ICD-10-CM | POA: Diagnosis not present

## 2023-11-10 DIAGNOSIS — F5101 Primary insomnia: Secondary | ICD-10-CM | POA: Diagnosis not present

## 2023-11-10 DIAGNOSIS — B2700 Gammaherpesviral mononucleosis without complication: Secondary | ICD-10-CM | POA: Diagnosis not present

## 2023-11-10 DIAGNOSIS — D849 Immunodeficiency, unspecified: Secondary | ICD-10-CM | POA: Diagnosis not present

## 2023-11-10 DIAGNOSIS — Z940 Kidney transplant status: Secondary | ICD-10-CM | POA: Diagnosis not present

## 2023-11-10 DIAGNOSIS — Z48298 Encounter for aftercare following other organ transplant: Secondary | ICD-10-CM | POA: Diagnosis not present

## 2023-11-10 DIAGNOSIS — I151 Hypertension secondary to other renal disorders: Secondary | ICD-10-CM | POA: Diagnosis not present
